# Patient Record
Sex: Male | Born: 1940 | Race: Black or African American | Hispanic: No | Smoking: Never smoker
Health system: Southern US, Community
[De-identification: ages and names within clinical notes are randomized; demographics above are authoritative.]

## PROBLEM LIST (undated history)

## (undated) DIAGNOSIS — I639 Cerebral infarction, unspecified: Secondary | ICD-10-CM

## (undated) DIAGNOSIS — F0390 Unspecified dementia without behavioral disturbance: Secondary | ICD-10-CM

## (undated) DIAGNOSIS — E119 Type 2 diabetes mellitus without complications: Secondary | ICD-10-CM

---

## 2018-05-30 ENCOUNTER — Ambulatory Visit: Payer: Self-pay | Admitting: Registered"

## 2018-06-04 ENCOUNTER — Ambulatory Visit: Payer: Self-pay | Admitting: Registered"

## 2018-10-03 ENCOUNTER — Other Ambulatory Visit (HOSPITAL_COMMUNITY): Payer: Self-pay | Admitting: Interventional Radiology

## 2018-10-03 ENCOUNTER — Telehealth (HOSPITAL_COMMUNITY): Payer: Self-pay

## 2018-10-03 DIAGNOSIS — Z931 Gastrostomy status: Secondary | ICD-10-CM

## 2018-10-03 NOTE — Telephone Encounter (Signed)
Called pt's son to schedule, no answer, left vm. AW

## 2018-10-07 ENCOUNTER — Other Ambulatory Visit (HOSPITAL_COMMUNITY): Payer: Self-pay | Admitting: Surgery

## 2018-10-07 DIAGNOSIS — Z931 Gastrostomy status: Secondary | ICD-10-CM

## 2018-10-08 ENCOUNTER — Ambulatory Visit (HOSPITAL_COMMUNITY)
Admission: RE | Admit: 2018-10-08 | Discharge: 2018-10-08 | Disposition: A | Discharge status: Home / Self Care | Payer: Medicare Other | Source: Ambulatory Visit | Attending: Interventional Radiology | Admitting: Interventional Radiology

## 2018-10-08 ENCOUNTER — Other Ambulatory Visit (HOSPITAL_COMMUNITY): Payer: Self-pay | Admitting: Surgery

## 2018-10-08 ENCOUNTER — Encounter (HOSPITAL_COMMUNITY): Payer: Self-pay | Admitting: Interventional Radiology

## 2018-10-08 DIAGNOSIS — Z431 Encounter for attention to gastrostomy: Secondary | ICD-10-CM | POA: Insufficient documentation

## 2018-10-08 DIAGNOSIS — Z931 Gastrostomy status: Secondary | ICD-10-CM

## 2018-10-08 HISTORY — PX: IR REPLC GASTRO/COLONIC TUBE PERCUT W/FLUORO: IMG2333

## 2018-10-08 MED ORDER — IOPAMIDOL (ISOVUE-300) INJECTION 61%
INTRAVENOUS | Status: AC
  Administered 2018-10-08: 20 mL
  Filled 2018-10-08: qty 50

## 2018-10-08 MED ORDER — LIDOCAINE VISCOUS HCL 2 % MT SOLN
OROMUCOSAL | Status: AC
  Filled 2018-10-08: qty 15

## 2018-10-08 NOTE — Procedures (Signed)
  Procedure: Exchange 75f G tube under fluoro   EBL:   minimal Complications:  none immediate  See full dictation in [employer].  Thora Lance MD Main # [phone] Pager  [phone]

## 2019-09-09 ENCOUNTER — Encounter (HOSPITAL_COMMUNITY): Payer: Self-pay | Admitting: Emergency Medicine

## 2019-09-09 ENCOUNTER — Other Ambulatory Visit: Payer: Self-pay

## 2019-09-09 ENCOUNTER — Emergency Department (HOSPITAL_COMMUNITY)
Admission: EM | Admit: 2019-09-09 | Discharge: 2019-09-10 | Disposition: A | Discharge status: Home / Self Care | Payer: Medicare Other | Attending: Emergency Medicine | Admitting: Emergency Medicine

## 2019-09-09 DIAGNOSIS — E119 Type 2 diabetes mellitus without complications: Secondary | ICD-10-CM | POA: Insufficient documentation

## 2019-09-09 DIAGNOSIS — K9423 Gastrostomy malfunction: Secondary | ICD-10-CM

## 2019-09-09 DIAGNOSIS — F0390 Unspecified dementia without behavioral disturbance: Secondary | ICD-10-CM | POA: Insufficient documentation

## 2019-09-09 DIAGNOSIS — T85598A Other mechanical complication of other gastrointestinal prosthetic devices, implants and grafts, initial encounter: Secondary | ICD-10-CM | POA: Insufficient documentation

## 2019-09-09 DIAGNOSIS — Z431 Encounter for attention to gastrostomy: Secondary | ICD-10-CM

## 2019-09-09 DIAGNOSIS — Y69 Unspecified misadventure during surgical and medical care: Secondary | ICD-10-CM | POA: Insufficient documentation

## 2019-09-09 DIAGNOSIS — Z8673 Personal history of transient ischemic attack (TIA), and cerebral infarction without residual deficits: Secondary | ICD-10-CM | POA: Insufficient documentation

## 2019-09-09 HISTORY — DX: Type 2 diabetes mellitus without complications: E11.9

## 2019-09-09 HISTORY — DX: Cerebral infarction, unspecified: I63.9

## 2019-09-09 HISTORY — DX: Unspecified dementia, unspecified severity, without behavioral disturbance, psychotic disturbance, mood disturbance, and anxiety: F03.90

## 2019-09-09 NOTE — ED Triage Notes (Signed)
Pts dtr states pt is demented and pulled out his G tube. She has the tube in a bag. Pt uses for tube feelings. Denies any bleeding at the site.

## 2019-09-10 ENCOUNTER — Emergency Department (HOSPITAL_COMMUNITY): Payer: Medicare Other

## 2019-09-10 MED ORDER — IOHEXOL 300 MG/ML  SOLN
30.0000 mL | Freq: Once | INTRAMUSCULAR | Status: AC | PRN
  Administered 2019-09-10: 30 mL

## 2019-09-10 NOTE — ED Provider Notes (Signed)
[facility] EMERGENCY DEPARTMENT Provider Note   CSN: [id number] Arrival date & time: 09/09/19  1757     History Chief complaint - G-tube Complication   Level 5 caveat due to dementia Justin Hawkins is a 78 y.o. male.  HPI    Patient with history of dementia and diabetes, stroke presents with G-tube malfunction.  Patient accidentally pulled out his G-tube.  Patient's daughter-in-law says that when she came home from work around 4 PM he reports it had just fallen out.  However exact time is unknown.  Patient has had a G-tube for least 2 years. There are not sure where this was initially placed. Patient has no other acute complaints Past Medical History:  Diagnosis Date  . Dementia (HCC)   . Diabetes mellitus without complication (HCC)   . Stroke [facility])     There are no problems to display for this patient.   Past Surgical History:  Procedure Laterality Date  . IR REPLC GASTRO/COLONIC TUBE PERCUT W/FLUORO  10/08/2018       History reviewed. No pertinent family history.  Social History   Tobacco Use  . Smoking status: Never Smoker  . Smokeless tobacco: Never Used  Substance Use Topics  . Alcohol use: Yes  . Drug use: Never    Home Medications Prior to Admission medications   Not on File    Allergies    Patient has no known allergies.  Review of Systems   Review of Systems  Unable to perform ROS: Dementia  Constitutional: Negative for fever.  Gastrointestinal: Negative for vomiting.    Physical Exam Updated Vital Signs BP 124/84 (BP Location: Left Arm)   Pulse 93   Temp 98.8 F (37.1 C) (Oral)   Resp 16   SpO2 100%   Physical Exam CONSTITUTIONAL: Elderly, no acute distress HEAD: Normocephalic/atraumatic EYES: EOMI ENMT: Mask in place NECK: supple no meningeal signs CV: S1/S2 noted, no murmurs/rubs/gallops noted LUNGS: Lungs are clear to auscultation bilaterally, no apparent distress ABDOMEN: soft, nontender, G-tube stoma  noted left upper quadrant, no erythema, no drainage NEURO: Pt is awake/alert, moves all extremitiesx4.  No facial droop.   EXTREMITIES:full ROM SKIN: warm, color normal   ED Results / Procedures / Treatments   Labs (all labs ordered are listed, but only abnormal results are displayed) Labs Reviewed - No data to display  EKG None  Radiology DG ABDOMEN PEG TUBE LOCATION  Result Date: 09/10/2019 CLINICAL DATA:  PEG placement. EXAM: ABDOMEN - 1 VIEW COMPARISON:  None. FINDINGS: Portable AP view of the abdomen obtained after the installation of 30 cc Omnipaque 300 through indwelling gastrostomy tube. Contrast opacifies the stomach and duodenum. There is no evidence of extravasation or leak. Moderate colonic stool burden. No evidence of free air. IMPRESSION: Gastrostomy tube in the stomach with contrast opacifying the stomach and duodenum. No evidence of extravasation or leak. Electronically Signed   By: Narda Rutherford M.D.   On: 09/10/2019 04:24    Procedures Gastrostomy tube replacement  Date/Time: 09/10/2019 3:40 AM Performed by: Zadie Rhine, MD Authorized by: Zadie Rhine, MD  Consent: Verbal consent obtained. Risks and benefits: risks, benefits and alternatives were discussed Consent given by: patient and guardian Time out: Immediately prior to procedure a "time out" was called to verify the correct patient, procedure, equipment, support staff and site/side marked as required. Local anesthesia used: no  Anesthesia: Local anesthesia used: no  Sedation: Patient sedated: no  Patient tolerance: patient tolerated the procedure well with  no immediate complications Comments: Area around stoma was cleansed extensively with Betadine. Sterile precautions were followed.  I was able to place a 20 [place] gastrostomy tube without difficulty. I Inserted 6 mL of saline into the balloon.  Patient tolerated procedure well       Medications Ordered in ED Medications - No data to  display  ED Course  I have reviewed the triage vital signs and the nursing notes.  Pertinent  imaging results that were available during my care of the patient were reviewed by me and considered in my medical decision making (see chart for details).    MDM Rules/Calculators/A&P                      Patient presents from home after his PEG tube fell out. Family said he had it  for 2 years.  Patient tolerated replacement very well.  Patient has a history of dementia and it is used for medications and some nutrition.  Patient appears to have good home support with his daughter-in-law. X-ray reveals appropriate placement.  Patient will  be discharged home. Final Clinical Impression(s) / ED Diagnoses Final diagnoses:  Gastrostomy tube dysfunction [facility])    Rx / DC Orders ED Discharge Orders    None       Ripley Fraise, MD 09/10/19 0430

## 2020-09-02 ENCOUNTER — Emergency Department (HOSPITAL_COMMUNITY)
Admission: EM | Admit: 2020-09-02 | Discharge: 2020-09-02 | Disposition: A | Discharge status: Home / Self Care | Payer: Medicare Other | Attending: Emergency Medicine | Admitting: Emergency Medicine

## 2020-09-02 ENCOUNTER — Emergency Department (HOSPITAL_COMMUNITY): Payer: Medicare Other

## 2020-09-02 ENCOUNTER — Other Ambulatory Visit: Payer: Self-pay

## 2020-09-02 DIAGNOSIS — K9423 Gastrostomy malfunction: Secondary | ICD-10-CM | POA: Insufficient documentation

## 2020-09-02 DIAGNOSIS — E119 Type 2 diabetes mellitus without complications: Secondary | ICD-10-CM | POA: Diagnosis not present

## 2020-09-02 MED ORDER — IOHEXOL 300 MG/ML  SOLN
50.0000 mL | Freq: Once | INTRAMUSCULAR | Status: AC | PRN
  Administered 2020-09-02: 50 mL

## 2020-09-02 NOTE — ED Notes (Signed)
Pt vss at discharge, nadn, 67f placed by provider, pt denies any complaints.

## 2020-09-02 NOTE — ED Triage Notes (Signed)
Pt pulled out g-tube at home today. Pt in no pain, no bleeding

## 2020-09-02 NOTE — ED Notes (Signed)
Pt aao2, gcs14, vss, nadn. Pt family states uses 13f g tube at home. Pt denies any complaints. Pt to xray at this time in stretcher.

## 2020-09-02 NOTE — ED Provider Notes (Signed)
[facility] EMERGENCY DEPARTMENT Provider Note  CSN: [id number] Arrival date & time: 09/02/20 1532    History Chief Complaint  Patient presents with  . g-tube    HPI  Justin Hawkins is a 79 y.o. male with history of stroke and dementia brought by daughter after she found him in bed with his G-tube in his hand. She is unsure how long it had been out. He has a had a feeding tube for about 4-5 years, she thinks this particular tube has been in for about a year. He has otherwise been in his normal state of health.   Past Medical History:  Diagnosis Date  . Dementia (HCC)   . Diabetes mellitus without complication (HCC)   . Stroke [facility])     Past Surgical History:  Procedure Laterality Date  . IR REPLC GASTRO/COLONIC TUBE PERCUT W/FLUORO  10/08/2018    No family history on file.  Social History   Tobacco Use  . Smoking status: Never Smoker  . Smokeless tobacco: Never Used  Substance Use Topics  . Alcohol use: Yes  . Drug use: Never     Home Medications Prior to Admission medications   Not on File     Allergies    Patient has no known allergies.   Review of Systems   Review of Systems A comprehensive review of systems was completed and negative except as noted in HPI.    Physical Exam BP 128/83 (BP Location: Right Arm)   Pulse 78   Temp 98.2 F (36.8 C) (Oral)   Resp 18   SpO2 100%   Physical Exam Vitals and nursing note reviewed.  HENT:     Head: Normocephalic.     Nose: Nose normal.  Eyes:     Extraocular Movements: Extraocular movements intact.  Pulmonary:     Effort: Pulmonary effort is normal.  Abdominal:     Tenderness: There is no abdominal tenderness.     Comments: Stoma in LUQ without signs of leaking, infection or skin breakdown  Musculoskeletal:        General: Normal range of motion.     Cervical back: Neck supple.  Skin:    Findings: No rash (on exposed skin).  Neurological:     Mental Status: He is alert and oriented to person,  place, and time.  Psychiatric:        Mood and Affect: Mood normal.      ED Results / Procedures / Treatments   Labs (all labs ordered are listed, but only abnormal results are displayed) Labs Reviewed - No data to display  EKG None  Radiology DG ABDOMEN PEG TUBE LOCATION  Result Date: 09/02/2020 CLINICAL DATA:  Tube placement EXAM: ABDOMEN - 1 VIEW COMPARISON:  09/10/2019 FINDINGS: Contrast opacification of the stomach and duodenum. Gastrostomy tube projects over the proximal lumen of the stomach. No gross extravasation. IMPRESSION: Gastrostomy tube positioned over the proximal stomach. Contrast opacification of the stomach and duodenum without gross contrast extravasation. Electronically Signed   By: Jasmine Pang M.D.   On: 09/02/2020 20:39    Procedures Gastrostomy tube replacement  Date/Time: 09/02/2020 8:08 PM Performed by: Pollyann Savoy, MD Authorized by: Pollyann Savoy, MD  Consent: Verbal consent obtained. Consent given by: guardian Time out: Immediately prior to procedure a "time out" was called to verify the correct patient, procedure, equipment, support staff and site/side marked as required. Local anesthesia used: no  Anesthesia: Local anesthesia used: no  Sedation: Patient sedated:  no  Patient tolerance: patient tolerated the procedure well with no immediate complications Comments: 20Fr G tube placed. Initially some difficulty finding a tract, but after probing with a cotton swab, the G-tube went in without difficulty     Medications Ordered in the ED Medications  iohexol (OMNIPAQUE) 300 MG/ML solution 50 mL (50 mLs Per Tube Contrast Given 09/02/20 2036)     MDM Rules/Calculators/A&P MDM Gtube replaced, will send for confirmatory xray. ED Course  I have reviewed the triage vital signs and the nursing notes.  Pertinent labs & imaging results that were available during my care of the patient were reviewed by me and considered in my medical  decision making (see chart for details).  Clinical Course as of 09/02/20 2044  Thu Sep 02, 2020  2043 Xray confirms proper positioning. Daughter advised to discuss regularly scheduled tube changes to avoid complications such as this. RTED for any other concerns.  [CS]    Clinical Course User Index [CS] Pollyann Savoy, MD    Final Clinical Impression(s) / ED Diagnoses Final diagnoses:  Gastrostomy malfunction [facility])    Rx / DC Orders ED Discharge Orders    None       Pollyann Savoy, MD 09/02/20 2044

## 2020-10-03 ENCOUNTER — Encounter (HOSPITAL_COMMUNITY): Payer: Self-pay | Admitting: Emergency Medicine

## 2020-10-03 ENCOUNTER — Inpatient Hospital Stay (HOSPITAL_COMMUNITY)
Admission: EM | Admit: 2020-10-03 | Discharge: 2020-10-07 | DRG: 177 | Disposition: A | Discharge status: Home Health | Payer: Medicare Other | Attending: Internal Medicine | Admitting: Internal Medicine

## 2020-10-03 ENCOUNTER — Emergency Department (HOSPITAL_COMMUNITY): Payer: Medicare Other

## 2020-10-03 ENCOUNTER — Other Ambulatory Visit: Payer: Self-pay

## 2020-10-03 DIAGNOSIS — E861 Hypovolemia: Secondary | ICD-10-CM | POA: Diagnosis present

## 2020-10-03 DIAGNOSIS — R32 Unspecified urinary incontinence: Secondary | ICD-10-CM | POA: Diagnosis present

## 2020-10-03 DIAGNOSIS — K4090 Unilateral inguinal hernia, without obstruction or gangrene, not specified as recurrent: Secondary | ICD-10-CM | POA: Diagnosis present

## 2020-10-03 DIAGNOSIS — K5900 Constipation, unspecified: Secondary | ICD-10-CM | POA: Diagnosis present

## 2020-10-03 DIAGNOSIS — R6510 Systemic inflammatory response syndrome (SIRS) of non-infectious origin without acute organ dysfunction: Secondary | ICD-10-CM | POA: Diagnosis present

## 2020-10-03 DIAGNOSIS — Z931 Gastrostomy status: Secondary | ICD-10-CM | POA: Diagnosis not present

## 2020-10-03 DIAGNOSIS — Z6826 Body mass index (BMI) 26.0-26.9, adult: Secondary | ICD-10-CM | POA: Diagnosis not present

## 2020-10-03 DIAGNOSIS — U071 COVID-19: Secondary | ICD-10-CM | POA: Diagnosis present

## 2020-10-03 DIAGNOSIS — Z7189 Other specified counseling: Secondary | ICD-10-CM | POA: Diagnosis not present

## 2020-10-03 DIAGNOSIS — E119 Type 2 diabetes mellitus without complications: Secondary | ICD-10-CM | POA: Diagnosis present

## 2020-10-03 DIAGNOSIS — R7989 Other specified abnormal findings of blood chemistry: Secondary | ICD-10-CM | POA: Diagnosis present

## 2020-10-03 DIAGNOSIS — E871 Hypo-osmolality and hyponatremia: Secondary | ICD-10-CM | POA: Diagnosis present

## 2020-10-03 DIAGNOSIS — R54 Age-related physical debility: Secondary | ICD-10-CM | POA: Diagnosis present

## 2020-10-03 DIAGNOSIS — E43 Unspecified severe protein-calorie malnutrition: Secondary | ICD-10-CM | POA: Diagnosis present

## 2020-10-03 DIAGNOSIS — F0390 Unspecified dementia without behavioral disturbance: Secondary | ICD-10-CM

## 2020-10-03 DIAGNOSIS — I10 Essential (primary) hypertension: Secondary | ICD-10-CM | POA: Diagnosis present

## 2020-10-03 DIAGNOSIS — D649 Anemia, unspecified: Secondary | ICD-10-CM | POA: Diagnosis present

## 2020-10-03 DIAGNOSIS — R64 Cachexia: Secondary | ICD-10-CM | POA: Diagnosis present

## 2020-10-03 DIAGNOSIS — Z515 Encounter for palliative care: Secondary | ICD-10-CM | POA: Diagnosis not present

## 2020-10-03 DIAGNOSIS — Z7984 Long term (current) use of oral hypoglycemic drugs: Secondary | ICD-10-CM

## 2020-10-03 DIAGNOSIS — E876 Hypokalemia: Secondary | ICD-10-CM | POA: Diagnosis present

## 2020-10-03 DIAGNOSIS — Z789 Other specified health status: Secondary | ICD-10-CM | POA: Diagnosis not present

## 2020-10-03 DIAGNOSIS — R627 Adult failure to thrive: Secondary | ICD-10-CM | POA: Diagnosis present

## 2020-10-03 DIAGNOSIS — Z8673 Personal history of transient ischemic attack (TIA), and cerebral infarction without residual deficits: Secondary | ICD-10-CM | POA: Diagnosis not present

## 2020-10-03 LAB — D-DIMER, QUANTITATIVE: D-Dimer, Quant: 0.43 ug/mL-FEU (ref 0.00–0.50)

## 2020-10-03 LAB — TRIGLYCERIDES: Triglycerides: 55 mg/dL (ref <150)

## 2020-10-03 LAB — COMPREHENSIVE METABOLIC PANEL
ALT: 22 U/L (ref 0–44)
AST: 28 U/L (ref 15–41)
Albumin: 2.9 g/dL — ABNORMAL LOW (ref 3.5–5.0)
Alkaline Phosphatase: 66 U/L (ref 38–126)
Anion gap: 12 (ref 5–15)
BUN: 25 mg/dL — ABNORMAL HIGH (ref 8–23)
CO2: 24 mmol/L (ref 22–32)
Calcium: 9.7 mg/dL (ref 8.9–10.3)
Chloride: 91 mmol/L — ABNORMAL LOW (ref 98–111)
Creatinine, Ser: 0.83 mg/dL (ref 0.61–1.24)
GFR, Estimated: >60 mL/min (ref >60)
Glucose, Bld: 165 mg/dL — ABNORMAL HIGH (ref 70–99)
Potassium: 4.4 mmol/L (ref 3.5–5.1)
Sodium: 127 mmol/L — ABNORMAL LOW (ref 135–145)
Total Bilirubin: 0.4 mg/dL (ref 0.3–1.2)
Total Protein: 5.9 g/dL — ABNORMAL LOW (ref 6.5–8.1)

## 2020-10-03 LAB — T4, FREE: Free T4: 1.37 ng/dL — ABNORMAL HIGH (ref 0.61–1.12)

## 2020-10-03 LAB — TROPONIN I (HIGH SENSITIVITY)
Troponin I (High Sensitivity): 45 ng/L — ABNORMAL HIGH (ref <18)
Troponin I (High Sensitivity): 10 ng/L (ref <18)

## 2020-10-03 LAB — CBC WITH DIFFERENTIAL/PLATELET
Abs Immature Granulocytes: 0.02 10*3/uL (ref 0.00–0.07)
Basophils Absolute: 0 10*3/uL (ref 0.0–0.1)
Basophils Relative: 0 %
Eosinophils Absolute: 0 10*3/uL (ref 0.0–0.5)
Eosinophils Relative: 0 %
HCT: 35.2 % — ABNORMAL LOW (ref 39.0–52.0)
Hemoglobin: 12.1 g/dL — ABNORMAL LOW (ref 13.0–17.0)
Immature Granulocytes: 1 %
Lymphocytes Relative: 14 %
Lymphs Abs: 0.5 10*3/uL — ABNORMAL LOW (ref 0.7–4.0)
MCH: 29.4 pg (ref 26.0–34.0)
MCHC: 34.4 g/dL (ref 30.0–36.0)
MCV: 85.6 fL (ref 80.0–100.0)
Monocytes Absolute: 0.3 10*3/uL (ref 0.1–1.0)
Monocytes Relative: 9 %
Neutro Abs: 2.5 10*3/uL (ref 1.7–7.7)
Neutrophils Relative %: 76 %
Platelets: 185 10*3/uL (ref 150–400)
RBC: 4.11 MIL/uL — ABNORMAL LOW (ref 4.22–5.81)
RDW: 12.5 % (ref 11.5–15.5)
WBC: 3.3 10*3/uL — ABNORMAL LOW (ref 4.0–10.5)
nRBC: 0 % (ref 0.0–0.2)

## 2020-10-03 LAB — TSH: TSH: 0.901 u[IU]/mL (ref 0.350–4.500)

## 2020-10-03 LAB — PROCALCITONIN: Procalcitonin: <0.1 ng/mL

## 2020-10-03 LAB — LACTIC ACID, PLASMA
Lactic Acid, Venous: 4.2 mmol/L (ref 0.5–1.9)
Lactic Acid, Venous: 2.8 mmol/L (ref 0.5–1.9)

## 2020-10-03 LAB — RESP PANEL BY RT-PCR (FLU A&B, COVID) ARPGX2
Influenza A by PCR: NEGATIVE
Influenza B by PCR: NEGATIVE
SARS Coronavirus 2 by RT PCR: POSITIVE — AB

## 2020-10-03 LAB — BRAIN NATRIURETIC PEPTIDE: B Natriuretic Peptide: 46 pg/mL (ref 0.0–100.0)

## 2020-10-03 LAB — FERRITIN: Ferritin: 226 ng/mL (ref 24–336)

## 2020-10-03 LAB — PROTIME-INR
INR: 0.9 (ref 0.8–1.2)
Prothrombin Time: 12.1 seconds (ref 11.4–15.2)

## 2020-10-03 LAB — LACTATE DEHYDROGENASE: LDH: 128 U/L (ref 98–192)

## 2020-10-03 LAB — C-REACTIVE PROTEIN: CRP: 2.2 mg/dL — ABNORMAL HIGH (ref <1.0)

## 2020-10-03 LAB — FIBRINOGEN: Fibrinogen: 553 mg/dL — ABNORMAL HIGH (ref 210–475)

## 2020-10-03 MED ORDER — SODIUM CHLORIDE 0.9 % IV SOLN
100.0000 mg | Freq: Every day | INTRAVENOUS | Status: AC
  Administered 2020-10-04 – 2020-10-05 (×2): 100 mg via INTRAVENOUS
  Filled 2020-10-03: qty 20
  Filled 2020-10-03: qty 100
  Filled 2020-10-03: qty 20

## 2020-10-03 MED ORDER — DEXAMETHASONE SODIUM PHOSPHATE 10 MG/ML IJ SOLN
6.0000 mg | Freq: Every day | INTRAMUSCULAR | Status: DC
  Administered 2020-10-04: 6 mg via INTRAVENOUS
  Filled 2020-10-03: qty 1

## 2020-10-03 MED ORDER — LACTATED RINGERS IV BOLUS
1000.0000 mL | Freq: Once | INTRAVENOUS | Status: AC
  Administered 2020-10-03: 1000 mL via INTRAVENOUS

## 2020-10-03 MED ORDER — SODIUM CHLORIDE 0.9 % IV SOLN
200.0000 mg | Freq: Once | INTRAVENOUS | Status: AC
  Administered 2020-10-04: 200 mg via INTRAVENOUS
  Filled 2020-10-03: qty 40

## 2020-10-03 MED ORDER — PIPERACILLIN-TAZOBACTAM 3.375 G IVPB 30 MIN
3.3750 g | Freq: Once | INTRAVENOUS | Status: AC
  Administered 2020-10-04: 3.375 g via INTRAVENOUS
  Filled 2020-10-03: qty 50

## 2020-10-03 NOTE — ED Notes (Signed)
Date and time results received: 10/03/20 2112 (use smartphrase ".now" to insert current time)  Test: Lactic Acid Critical Value: 4.2  Name of Provider Notified: Rancour, MD  Orders Received? Or Actions Taken?:

## 2020-10-03 NOTE — ED Triage Notes (Signed)
BIB [facility] from home. Pt lives at home with family. Hx: dementia. Increased weakness and productive cough x2 days, incontinence with foul smelling urine and fever per family. Pt hasn't being eating well.   109 heart rate  101.3 tympanic.

## 2020-10-03 NOTE — ED Provider Notes (Signed)
[facility] EMERGENCY DEPARTMENT Provider Note   CSN: [id number] Arrival date & time: 10/03/20  2012     History Chief Complaint  Patient presents with  . Weakness    Justin Hawkins is a 80 y.o. male.  HPI Pt is a 80 y/o male with a history of dementia, DM, prior stroke, G-tube in place who presents to ED with complaint of weakness. Pt states his primary complaint is shortness of breath and he states that he has noticed "cold" symptoms and dyspnea starting today. Pt states he lives with his son. Pt persistently coughing and appears dyspneic throughout interview, unable to answer most of my questions.  Have contacted pt's daughter-in-law Michaeal Davis [phone]: - Started having incontinence, not swallowing/chewing his food, very congested x about 2-3 days - "A little incoherent" - Has had some choking, leaving food in his mouth - COVID exposure - daughter-in-law's grandson tested positive for COVID on Friday (10/01/20) after being around the family at Christmastime, pt's son also has been a little sick but now better - Pt has had 2 vaccines, no booster yet - Allowed to eat by mouth, but has g-tube for nutritional supplementation - Has been out of his BP medications for a few days    Past Medical History:  Diagnosis Date  . Dementia (HCC)   . Diabetes mellitus without complication (HCC)   . Stroke [facility])     There are no problems to display for this patient.   Past Surgical History:  Procedure Laterality Date  . IR REPLC GASTRO/COLONIC TUBE PERCUT W/FLUORO  10/08/2018       No family history on file.  Social History   Tobacco Use  . Smoking status: Never Smoker  . Smokeless tobacco: Never Used  Substance Use Topics  . Alcohol use: Yes  . Drug use: Never    Home Medications Prior to Admission medications   Not on File    Allergies    Patient has no known allergies.  Review of Systems   Review of Systems  Unable to perform ROS: Dementia  (Pt persistently coughing and having trouble completing ROS; pt also demented. Have included symptoms noted by pt and daughter in law.)  Constitutional: Negative for chills and fever.  HENT: Positive for congestion, rhinorrhea and trouble swallowing.   Respiratory: Positive for cough and shortness of breath.   Gastrointestinal: Negative for diarrhea and vomiting.  Skin: Negative for color change and rash.    Physical Exam Updated Vital Signs BP (!) 141/79 (BP Location: Left Arm)   Pulse (!) 104   Temp 98.4 F (36.9 C) (Oral) Comment: oral, febrile with EMS  Resp (!) 24   SpO2 98%   Physical Exam Vitals and nursing note reviewed.  Constitutional:      General: He is in acute distress.     Appearance: He is well-developed and well-nourished. He is ill-appearing. He is not toxic-appearing.  HENT:     Head: Normocephalic and atraumatic.  Eyes:     General: No scleral icterus. Cardiovascular:     Rate and Rhythm: Regular rhythm. Tachycardia present.     Pulses: Normal pulses.     Heart sounds: No friction rub. No gallop.   Pulmonary:     Breath sounds: Wheezing, rhonchi and rales present.     Comments: Frequent productive cough noted on exam Abdominal:     Palpations: Abdomen is soft.     Tenderness: There is no abdominal tenderness. There is no guarding  or rebound.     Comments: G-tube in place left upper quadrant.  There is no surrounding induration, erythema, or drainage.  Musculoskeletal:        General: No edema.     Cervical back: Neck supple.     Right lower leg: No edema.     Left lower leg: No edema.  Skin:    General: Skin is warm and dry.  Neurological:     Mental Status: He is alert.     Comments: Alert, moves all extremities spontaneously, attempts to answer questions.  Difficulty attending to questions.  There is significant hearing loss.  Psychiatric:        Mood and Affect: Mood and affect normal.        Behavior: Behavior normal.     ED Results /  Procedures / Treatments   Labs (all labs ordered are listed, but only abnormal results are displayed) Labs Reviewed  RESP PANEL BY RT-PCR (FLU A&B, COVID) ARPGX2 - Abnormal; Notable for the following components:      Result Value   SARS Coronavirus 2 by RT PCR POSITIVE (*)    All other components within normal limits  COMPREHENSIVE METABOLIC PANEL - Abnormal; Notable for the following components:   Sodium 127 (*)    Chloride 91 (*)    Glucose, Bld 165 (*)    BUN 25 (*)    Total Protein 5.9 (*)    Albumin 2.9 (*)    All other components within normal limits  LACTIC ACID, PLASMA - Abnormal; Notable for the following components:   Lactic Acid, Venous 4.2 (*)    All other components within normal limits  LACTIC ACID, PLASMA - Abnormal; Notable for the following components:   Lactic Acid, Venous 2.8 (*)    All other components within normal limits  CBC WITH DIFFERENTIAL/PLATELET - Abnormal; Notable for the following components:   WBC 3.3 (*)    RBC 4.11 (*)    Hemoglobin 12.1 (*)    HCT 35.2 (*)    Lymphs Abs 0.5 (*)    All other components within normal limits  T4, FREE - Abnormal; Notable for the following components:   Free T4 1.37 (*)    All other components within normal limits  FIBRINOGEN - Abnormal; Notable for the following components:   Fibrinogen 553 (*)    All other components within normal limits  C-REACTIVE PROTEIN - Abnormal; Notable for the following components:   CRP 2.2 (*)    All other components within normal limits  TROPONIN I (HIGH SENSITIVITY) - Abnormal; Notable for the following components:   Troponin I (High Sensitivity) 45 (*)    All other components within normal limits  CULTURE, BLOOD (ROUTINE X 2)  CULTURE, BLOOD (ROUTINE X 2)  PROTIME-INR  TSH  PROCALCITONIN  LACTATE DEHYDROGENASE  FERRITIN  TRIGLYCERIDES  D-DIMER, QUANTITATIVE (NOT AT [facility])  BRAIN NATRIURETIC PEPTIDE  URINALYSIS, ROUTINE W REFLEX MICROSCOPIC  HEMOGLOBIN A1C  CBC   CREATININE, SERUM  TROPONIN I (HIGH SENSITIVITY)    EKG EKG Interpretation  Date/Time:  Sunday October 03 2020 22:51:49 EST Ventricular Rate:  158 PR Interval:    QRS Duration: 160 QT Interval:  343 QTC Calculation: 441 R Axis:   43 Text Interpretation: Artifact Sinus tachycardia Artifact in lead(s) II aVF V1 V2 V3 V4 V5 V6 Confirmed by Glynn Octave [id number]) on 10/03/2020 10:56:29 PM   Radiology DG Chest Portable 1 View  Result Date: 10/03/2020 CLINICAL DATA:  Infection. Increased  weakness and cough for 2 days. Fever. EXAM: PORTABLE CHEST 1 VIEW COMPARISON:  None. FINDINGS: Lung volumes are low. No focal airspace disease. Normal heart size. Mild aortic tortuosity. Otherwise normal mediastinal contours. No pulmonary edema, pleural fluid or pneumothorax. Multiple skin folds project over the chest. Scoliotic curvature of the spine. No acute osseous abnormalities are seen. IMPRESSION: Low lung volumes without acute abnormality. Electronically Signed   By: Narda Rutherford M.D.   On: 10/03/2020 21:09    Procedures Procedures (including critical care time)  Medications Ordered in ED Medications  remdesivir 200 mg in sodium chloride 0.9% 250 mL IVPB (0 mg Intravenous Stopped 10/04/20 0046)    Followed by  remdesivir 100 mg in sodium chloride 0.9 % 100 mL IVPB (has no administration in time range)  lactated ringers bolus 1,000 mL (0 mLs Intravenous Stopped 10/04/20 0046)    ED Course  I have reviewed the triage vital signs and the nursing notes.  Pertinent labs & imaging results that were available during my care of the patient were reviewed by me and considered in my medical decision making (see chart for details).    MDM Rules/Calculators/A&P                          80 year old male with cough, congestion, changes in mental status, family concerns.  I am most concerned for possible COVID-19 in context of recent exposure to positive family member, but differential includes  aspiration pneumonia/pneumonitis, UTI, delirium.  Labs reviewed and notable for positive SARS-CoV-2 and picture concerning for possible sepsis.  Lactic acid 4.2.  Troponin 45 --> 10.  Mild hyponatremia, BUN elevated, leukopenia.  CXR AP portable shows low lung volumes but otherwise no abnormality noted. U/A pending.  Impression is likely COVID-19 pneumonia.  Given patient's chronic illness, risk for aspiration and change in mental status with worsening PO intake, feel he would benefit from admission.  IV fluid resuscitation started with single LR bolus.  Remdesivir and Decadron ordered.  Pt will be admitted to the hospitalist service. He remained in stable condition at time of admission.  Final Clinical Impression(s) / ED Diagnoses Final diagnoses:  COVID-19  Dementia without behavioral disturbance, unspecified dementia type (HCC)     Corliss Blacker, MD 10/04/20 2229    Glynn Octave, MD 10/04/20 1301

## 2020-10-04 ENCOUNTER — Encounter (HOSPITAL_COMMUNITY): Payer: Self-pay | Admitting: Internal Medicine

## 2020-10-04 ENCOUNTER — Inpatient Hospital Stay (HOSPITAL_COMMUNITY): Payer: Medicare Other

## 2020-10-04 DIAGNOSIS — F0390 Unspecified dementia without behavioral disturbance: Secondary | ICD-10-CM

## 2020-10-04 DIAGNOSIS — U071 COVID-19: Secondary | ICD-10-CM | POA: Diagnosis not present

## 2020-10-04 DIAGNOSIS — Z515 Encounter for palliative care: Secondary | ICD-10-CM

## 2020-10-04 DIAGNOSIS — R6510 Systemic inflammatory response syndrome (SIRS) of non-infectious origin without acute organ dysfunction: Secondary | ICD-10-CM | POA: Diagnosis not present

## 2020-10-04 DIAGNOSIS — Z7189 Other specified counseling: Secondary | ICD-10-CM

## 2020-10-04 DIAGNOSIS — D649 Anemia, unspecified: Secondary | ICD-10-CM | POA: Diagnosis present

## 2020-10-04 DIAGNOSIS — Z789 Other specified health status: Secondary | ICD-10-CM

## 2020-10-04 LAB — CREATININE, SERUM
Creatinine, Ser: 0.68 mg/dL (ref 0.61–1.24)
GFR, Estimated: >60 mL/min (ref >60)

## 2020-10-04 LAB — CBC
HCT: 36.6 % — ABNORMAL LOW (ref 39.0–52.0)
Hemoglobin: 12.7 g/dL — ABNORMAL LOW (ref 13.0–17.0)
MCH: 29.8 pg (ref 26.0–34.0)
MCHC: 34.7 g/dL (ref 30.0–36.0)
MCV: 85.9 fL (ref 80.0–100.0)
Platelets: 179 10*3/uL (ref 150–400)
RBC: 4.26 MIL/uL (ref 4.22–5.81)
RDW: 12.4 % (ref 11.5–15.5)
WBC: 4.2 10*3/uL (ref 4.0–10.5)
nRBC: 0 % (ref 0.0–0.2)

## 2020-10-04 LAB — CBG MONITORING, ED
Glucose-Capillary: 145 mg/dL — ABNORMAL HIGH (ref 70–99)
Glucose-Capillary: 63 mg/dL — ABNORMAL LOW (ref 70–99)
Glucose-Capillary: 114 mg/dL — ABNORMAL HIGH (ref 70–99)
Glucose-Capillary: 122 mg/dL — ABNORMAL HIGH (ref 70–99)
Glucose-Capillary: 118 mg/dL — ABNORMAL HIGH (ref 70–99)

## 2020-10-04 LAB — URINALYSIS, ROUTINE W REFLEX MICROSCOPIC
Bilirubin Urine: NEGATIVE
Glucose, UA: 50 mg/dL — AB
Hgb urine dipstick: NEGATIVE
Ketones, ur: NEGATIVE mg/dL
Leukocytes,Ua: NEGATIVE
Nitrite: NEGATIVE
Protein, ur: NEGATIVE mg/dL
Specific Gravity, Urine: 1.01 (ref 1.005–1.030)
pH: 7 (ref 5.0–8.0)

## 2020-10-04 LAB — HEMOGLOBIN A1C
Hgb A1c MFr Bld: 7.7 % — ABNORMAL HIGH (ref 4.8–5.6)
Mean Plasma Glucose: 174.29 mg/dL

## 2020-10-04 MED ORDER — BISACODYL 10 MG RE SUPP
10.0000 mg | Freq: Once | RECTAL | Status: DC
  Filled 2020-10-04: qty 1

## 2020-10-04 MED ORDER — POLYETHYLENE GLYCOL 3350 17 G PO PACK: 17.0000 g | PACK | Freq: Every day | ORAL | Status: DC

## 2020-10-04 MED ORDER — IOHEXOL 300 MG/ML  SOLN
75.0000 mL | Freq: Once | INTRAMUSCULAR | Status: AC | PRN
  Administered 2020-10-04: 75 mL via INTRAVENOUS

## 2020-10-04 MED ORDER — INSULIN ASPART 100 UNIT/ML ~~LOC~~ SOLN
0.0000 [IU] | Freq: Three times a day (TID) | SUBCUTANEOUS | Status: DC
  Administered 2020-10-06: 1 [IU] via SUBCUTANEOUS
  Administered 2020-10-06: 2 [IU] via SUBCUTANEOUS

## 2020-10-04 MED ORDER — PIPERACILLIN-TAZOBACTAM 3.375 G IVPB: 3.3750 g | Freq: Three times a day (TID) | INTRAVENOUS | Status: DC

## 2020-10-04 MED ORDER — ENOXAPARIN SODIUM 40 MG/0.4ML ~~LOC~~ SOLN
40.0000 mg | SUBCUTANEOUS | Status: DC
  Administered 2020-10-04: 40 mg via SUBCUTANEOUS
  Filled 2020-10-04: qty 0.4

## 2020-10-04 MED ORDER — DEXTROSE-NACL 5-0.9 % IV SOLN: INTRAVENOUS | Status: DC

## 2020-10-04 MED ORDER — LACTATED RINGERS IV SOLN: INTRAVENOUS | Status: DC

## 2020-10-04 MED ORDER — ACETAMINOPHEN 650 MG RE SUPP: 650.0000 mg | Freq: Four times a day (QID) | RECTAL | Status: DC | PRN

## 2020-10-04 MED ORDER — HYDRALAZINE HCL 20 MG/ML IJ SOLN: 10.0000 mg | INTRAMUSCULAR | Status: DC | PRN

## 2020-10-04 MED ORDER — ACETAMINOPHEN 325 MG PO TABS: 650.0000 mg | ORAL_TABLET | Freq: Four times a day (QID) | ORAL | Status: DC | PRN

## 2020-10-04 MED ORDER — INSULIN ASPART 100 UNIT/ML ~~LOC~~ SOLN
0.0000 [IU] | SUBCUTANEOUS | Status: DC
  Administered 2020-10-04: 1 [IU] via SUBCUTANEOUS

## 2020-10-04 MED ORDER — POLYETHYLENE GLYCOL 3350 17 G PO PACK
17.0000 g | PACK | Freq: Every day | ORAL | Status: DC
  Administered 2020-10-04 – 2020-10-05 (×2): 17 g
  Filled 2020-10-04 (×2): qty 1

## 2020-10-04 MED ORDER — DEXTROSE 50 % IV SOLN
1.0000 | Freq: Once | INTRAVENOUS | Status: AC
  Administered 2020-10-04: 50 mL via INTRAVENOUS
  Filled 2020-10-04: qty 50

## 2020-10-04 NOTE — Progress Notes (Signed)
Pharmacy Antibiotic Note  Justin Hawkins is a 80 y.o. male admitted on 10/03/2020 with pneumonia.  Pharmacy has been consulted for zosyn dosing.  Plan: Zosyn 3.375g IV q8h (4 hour infusion). F/u renal function, cultures and clinical course    Temp (24hrs), Avg:98.8 F (37.1 C), Min:98.4 F (36.9 C), Max:99.2 F (37.3 C)  Recent Labs  Lab 10/03/20 2010 10/03/20 2249  WBC 3.3*  --   CREATININE 0.83  --   LATICACIDVEN 4.2* 2.8*    CrCl cannot be calculated (Unknown ideal weight.).    No Known Allergies  Thank you for allowing pharmacy to be a part of this patient's care.  Talbert Cage P[place]et 10/04/2020 12:32 AM

## 2020-10-04 NOTE — ED Notes (Signed)
Spoke with Duanne Guess and and daughter Carollee Herter, gave update on pt and care. Per son pt has been holding food in mouth the last few days, hasn't been swallowing. Pt has been coughing. Pt does get water and ensure through his G-tube when he doesn't want to eat or drink. Pt is supposed to use a walker but usually forgets, has been incontinent for the last few days.

## 2020-10-04 NOTE — ED Notes (Signed)
Unable to obtain EKG due to artifact and pt continuously coughing.

## 2020-10-04 NOTE — ED Notes (Signed)
Page returned, orders to be placed.

## 2020-10-04 NOTE — ED Notes (Signed)
Provider at bedside

## 2020-10-04 NOTE — Progress Notes (Signed)
PROGRESS NOTE    Justin Dillingrthur L Lampton  WUJ:[id number] DOB: 21-Jan-1941 DOA: 10/03/2020 PCP: [facility]    Brief Narrative:  Justin Hawkins is a 80 year-old male with past medical history significant for dementia, type 2 diabetes mellitus, essential hypertension, previous CVA who was brought to the emergency department after patient has not been eating well over the last 2 days with cold symptoms with persistent cough.  Patient with a PEG tube, family states does not usually use the PEG for nutrition, only when he does not eat anything.  No reported nausea, vomiting, diarrhea, chest pain or shortness of breath.  Patient apparently not "acting like himself" which is a change and thus he was brought to the ED for further evaluation.  In the ED, temperature 98.4, HR 104, RR 24, BP 141/79, SPO2 98% on room air.  Sodium 127, potassium 4.4, chloride 91, CO2 24, glucose 165, BUN 25, creatinine 0.83, AST 28, ALT 22, total bilirubin 0.4.  WBC 3.3, hemoglobin 12.1, platelet 185.  Troponin 45>10.  Lactic acid 4.2>2.8.  INR 0.9.  TSH 0.901, free T4 1.37.  Covid-19 PCR positive.  Influenza A/B PCR negative.  Chest x-ray with low lung volumes, otherwise no acute cardiopulmonary disease process.  Patient was started on Zosyn by ED physician for concern of underlying infectious etiology.  Hospitalist service consulted for further evaluation and treatment adult failure to thrive, and COVID-19 viral infection.   Assessment & Plan:   Principal Problem:   SIRS (systemic inflammatory response syndrome) (HCC) Active Problems:   COVID-19 virus infection   Normocytic normochromic anemia   SIRS, POA Acute Covid-19 viral infection during the ongoing Covid 19 Pandemic - POA Patient presenting from home with poor oral intake, failure to thrive.  Was found to be incidentally positive for COVID-19 PCR.  Son reports that patient is vaccinated, has received 2 doses of the Pfizer vaccine, but has not received a booster  as son reports it is not that time as of yet.  WBC and procalcitonin within normal limits, discontinued Zosyn. --COVID test: + PCR 1/9 --CRP 2.2 --ddimer 0.43 --Remdesivir, plan 5-day course (Day #1/3) -- Currently oxygenating well on room air --Continue supportive care with albuterol MDI prn, vitamin C, zinc, Tylenol, antitussives (benzonatate/ Mucinex/Tussionex) --Follow CBC, CMP, D-dimer, and CRP daily --Continue airborne/contact isolation precautions for 3 weeks from the day of diagnosis  The treatment plan and use of medications and known side effects were discussed with patient/family. Some of the medications used are based on case reports/anecdotal data.  All other medications being used in the management of COVID-19 based on limited study data.  Complete risks and long-term side effects are unknown, however in the best clinical judgment they seem to be of some benefit.  Patient wanted to proceed with treatment options provided.  Constipation CT abdomen/pelvis with contrast with no acute intra-abdominal abnormality, gastrotomy tube in place with large colonic stool burden with diffuse mild distention. --miralax per tube daily --bisacodyl supp x 1 today --Continue monitor output closely  Adult failure to thrive Patient presenting from home with poor oral intake in the preceding 2 days with associated cold symptoms and persistent cough.  Was found to be incidentally positive for COVID-19 as above.  Patient also with history of dementia and chronic PEG tube in place; although patient only utilizes PEG tube occasionally when he does not eat. --SLP evaluation for possible underlying dysphagia, may need to start tube feeds --Palliative care consulted for goals of care, medical  decision making  Hyponatremia Sodium 127 on admission, likely hypovolemic hyponatremia in the setting of poor oral intake. --Continue D5 NS at 75 MLS per hour --Follow BMP daily  Dementia Patient is pleasantly  confused. --Keep blinds open and lights on during daylight hours --Minimize the use of opioids/benzodiazepines  Type 2 diabetes mellitus Hemoglobin A1c 7.7.  On metformin 1000 mg p.o. daily at home. --Insulin sliding scale for coverage while inpatient --CBGs QAC/HS  Severe protein calorie malnutrition Patient with significant muscle wasting, thin and cachectic in appearance.  Has had poor appetite over the last 2 days, but suspect this has been going ongoing for some time.  Has a PEG tube in place, only utilizes per family when he does not eat much. --Pending SLP evaluation for ability to swallow -- Will consult nutrition following speech evaluation for diet/nutrition supplementation recommendations   DVT prophylaxis: Lovenox Code Status: Full code Family Communication: Updated patient's son, Marilu FavreClarence via telephone this morning  Disposition Plan:  Status is: Inpatient  Remains inpatient appropriate because:Persistent severe electrolyte disturbances, Altered mental status, Ongoing diagnostic testing needed not appropriate for outpatient work up, Unsafe d/c plan, IV treatments appropriate due to intensity of illness or inability to take PO and Inpatient level of care appropriate due to severity of illness   Dispo: The patient is from: Home              Anticipated d/c is to: Home              Anticipated d/c date is: 3 days              Patient currently is not medically stable to d/c.   Consultants:   Palliative care  Procedures:   none  Antimicrobials:   Zosyn 1/9 - 1/10   Subjective: Patient seen and examined at bedside, continues in ED holding area.  Pleasantly confused.  No specific complaints this morning.  No family present at bedside.  Unable to obtain any further ROS given his underlying dementia, although no specific complaints.  Nursing concerned this morning with borderline low glucose.  Otherwise no acute concerns overnight per nursing  staff.  Objective: Vitals:   10/04/20 0630 10/04/20 0700 10/04/20 0715 10/04/20 0900  BP: 118/71 117/69 102/76 106/75  Pulse: 94 96 95 (!) 102  Resp: 20 (!) 21 (!) 23 16  Temp:    98.2 F (36.8 C)  TempSrc:    Oral  SpO2: 100% 98% 98% 96%    Intake/Output Summary (Last 24 hours) at 10/04/2020 1026 Last data filed at 10/04/2020 0940 Gross per 24 hour  Intake 600 ml  Output --  Net 600 ml   There were no vitals filed for this visit.  Examination:  General exam: Appears calm and comfortable, pleasantly confused, thin/cachectic in appearance with significant muscle wasting Respiratory system: Clear to auscultation. Respiratory effort normal.  Oxygenating well on room air Cardiovascular system: S1 & S2 heard, RRR. No JVD, murmurs, rubs, gallops or clicks. No pedal edema. Gastrointestinal system: Abdomen is nondistended, soft and nontender. No organomegaly or masses felt. Normal bowel sounds heard.  PEG tube noted Central nervous system: Alert, not oriented to person/place/time/situation. No focal neurological deficits. Extremities: Symmetric 5 x 5 power. Skin: No rashes, lesions or ulcers Psychiatry: Judgement and insight appear poor. Mood & affect appropriate.     Data Reviewed: I have personally reviewed following labs and imaging studies  CBC: Recent Labs  Lab 10/03/20 2010 10/04/20 0406  WBC 3.3* 4.2  NEUTROABS 2.5  --   HGB 12.1* 12.7*  HCT 35.2* 36.6*  MCV 85.6 85.9  PLT 185 179   Basic Metabolic Panel: Recent Labs  Lab 10/03/20 2010 10/04/20 0406  NA 127*  --   K 4.4  --   CL 91*  --   CO2 24  --   GLUCOSE 165*  --   BUN 25*  --   CREATININE 0.83 0.68  CALCIUM 9.7  --    GFR: CrCl cannot be calculated (Unknown ideal weight.). Liver Function Tests: Recent Labs  Lab 10/03/20 2010  AST 28  ALT 22  ALKPHOS 66  BILITOT 0.4  PROT 5.9*  ALBUMIN 2.9*   No results for input(s): LIPASE, AMYLASE in the last 168 hours. No results for input(s): AMMONIA  in the last 168 hours. Coagulation Profile: Recent Labs  Lab 10/03/20 2010  INR 0.9   Cardiac Enzymes: No results for input(s): CKTOTAL, CKMB, CKMBINDEX, TROPONINI in the last 168 hours. BNP (last 3 results) No results for input(s): PROBNP in the last 8760 hours. HbA1C: Recent Labs    10/04/20 0406  HGBA1C 7.7*   CBG: Recent Labs  Lab 10/04/20 0400 10/04/20 0907  GLUCAP 145* 63*   Lipid Profile: Recent Labs    10/03/20 2250  TRIG 55   Thyroid Function Tests: Recent Labs    10/03/20 2010 10/03/20 2249  TSH  --  0.901  FREET4 1.37*  --    Anemia Panel: Recent Labs    10/03/20 2250  FERRITIN 226   Sepsis Labs: Recent Labs  Lab 10/03/20 2010 10/03/20 2249 10/03/20 2250  PROCALCITON  --   --  <0.10  LATICACIDVEN 4.2* 2.8*  --     Recent Results (from the past 240 hour(s))  Culture, blood (Routine x 2)     Status: None (Preliminary result)   Collection Time: 10/03/20  8:09 PM   Specimen: BLOOD LEFT HAND  Result Value Ref Range Status   Specimen Description BLOOD LEFT HAND  Final   Special Requests   Final    BOTTLES DRAWN AEROBIC ONLY Blood Culture results may not be optimal due to an inadequate volume of blood received in culture bottles   Culture   Final    NO GROWTH < 12 HOURS Performed at [facility], [address], [place], [place] [address]    Report Status PENDING  Incomplete  Resp Panel by RT-PCR (Flu A&B, Covid) Nasopharyngeal Swab     Status: Abnormal   Collection Time: 10/03/20  8:15 PM   Specimen: Nasopharyngeal Swab; Nasopharyngeal(NP) swabs in vial transport medium  Result Value Ref Range Status   SARS Coronavirus 2 by RT PCR POSITIVE (A) NEGATIVE Final    Comment: RESULT CALLED TO, READ BACK BY AND VERIFIED WITH: RN ALEXIS DAVIDSON BY MESSAN H. AT 2155 ON 10/03/2020 (NOTE) SARS-CoV-2 target nucleic acids are DETECTED.  The SARS-CoV-2 RNA is generally detectable in upper respiratory specimens during the acute phase of  infection. Positive results are indicative of the presence of the identified virus, but do not rule out bacterial infection or co-infection with other pathogens not detected by the test. Clinical correlation with patient history and other diagnostic information is necessary to determine patient infection status. The expected result is Negative.  Fact Sheet for Patients: BloggerCourse.com  Fact Sheet for Healthcare Providers: SeriousBroker.it  This test is not yet approved or cleared by the [place] FDA and  has been authorized for detection and/or diagnosis of  SARS-CoV-2 by FDA under an Emergency Use Authorization (EUA).  This EUA will remain in effect (meanin g this test can be used) for the duration of  the COVID-19 declaration under Section 564(b)(1) of the Act, 21 U.S.C. section 360bbb-3(b)(1), unless the authorization is terminated or revoked sooner.     Influenza A by PCR NEGATIVE NEGATIVE Final   Influenza B by PCR NEGATIVE NEGATIVE Final    Comment: (NOTE) The Xpert Xpress SARS-CoV-2/FLU/RSV plus assay is intended as an aid in the diagnosis of influenza from Nasopharyngeal swab specimens and should not be used as a sole basis for treatment. Nasal washings and aspirates are unacceptable for Xpert Xpress SARS-CoV-2/FLU/RSV testing.  Fact Sheet for Patients: BloggerCourse.com  Fact Sheet for Healthcare Providers: SeriousBroker.it  This test is not yet approved or cleared by the [place] FDA and has been authorized for detection and/or diagnosis of SARS-CoV-2 by FDA under an Emergency Use Authorization (EUA). This EUA will remain in effect (meaning this test can be used) for the duration of the COVID-19 declaration under Section 564(b)(1) of the Act, 21 U.S.C. section 360bbb-3(b)(1), unless the authorization is terminated or revoked.  Performed at [facility], [address], [place], [place] [address]   Culture, blood (Routine x 2)     Status: None (Preliminary result)   Collection Time: 10/03/20  8:19 PM   Specimen: BLOOD RIGHT FOREARM  Result Value Ref Range Status   Specimen Description BLOOD RIGHT FOREARM  Final   Special Requests   Final    BOTTLES DRAWN AEROBIC AND ANAEROBIC Blood Culture adequate volume   Culture   Final    NO GROWTH < 12 HOURS Performed at [facility], [address] [place], [place] [address]    Report Status PENDING  Incomplete         Radiology Studies: CT ABDOMEN PELVIS W CONTRAST  Result Date: 10/04/2020 CLINICAL DATA:  80 year old male with concern for abdominal abscess or infection. EXAM: CT ABDOMEN AND PELVIS WITH CONTRAST TECHNIQUE: Multidetector CT imaging of the abdomen and pelvis was performed using the standard protocol following bolus administration of intravenous contrast. CONTRAST:  59mL OMNIPAQUE IOHEXOL 300 MG/ML  SOLN COMPARISON:  None. FINDINGS: Lower chest: Scattered bibasilar ground-glass opacities and consolidative., Most prominent in the dependent lower lobes. No pleural effusions. The heart is normal in size. No pericardial effusion. Hepatobiliary: The liver is normal in size, contour, and attenuation. No intra or extrahepatic biliary ductal dilation. The gallbladder is present and unremarkable. Pancreas: Atrophic, not well visualized. Spleen: Normal in size without focal abnormality. Adrenals/Urinary Tract: Adrenal glands are unremarkable. Few scattered simple cortical cyst in the left kidney, largest in the inferior pole measuring up to 5 cm. Kidneys are otherwise normal, without renal calculi, focal lesion, or hydronephrosis. Bladder is unremarkable. Stomach/Bowel: Gastrostomy tube in place with the retention balloon along the posterior wall of the gastric body. The majority of the small bowel in the left hemiabdomen, decompressed. Large colonic stool burden with diffuse mild  distension. The appendix is not definitively visualized. Vascular/Lymphatic: No significant vascular findings are present. No enlarged abdominal or pelvic lymph nodes. Reproductive: Prostatomegaly, measuring up to 6.1 cm in axial dimension, with protrusion into the base of the bladder. Other: No abdominal wall hernia or abnormality. No abdominopelvic ascites. Musculoskeletal: Multilevel degenerative changes of the thoracolumbar spine associated sigmoid curvature. No evidence of acute fracture IMPRESSION: 1. Scattered bibasilar, peripheral predominant independent ground-glass and consolidative opacities as could be seen with  multi focal pneumonia. 2. No acute intra-abdominal abnormality. 3. Gastrostomy tube in place with the retention balloon along the posterior wall of the gastric body. If the tube is malfunctioning or leaking along the track, consider retraction of the gastrostomy tube to position the retention balloon along the anterior aspect of the stomach followed by cinching the external bumper down to the level of the skin. Marliss Coots, MD [facility]iology Electronically Signed   By: Marliss Coots MD   On: 10/04/2020 09:21   DG Chest Portable 1 View  Result Date: 10/03/2020 CLINICAL DATA:  Infection. Increased weakness and cough for 2 days. Fever. EXAM: PORTABLE CHEST 1 VIEW COMPARISON:  None. FINDINGS: Lung volumes are low. No focal airspace disease. Normal heart size. Mild aortic tortuosity. Otherwise normal mediastinal contours. No pulmonary edema, pleural fluid or pneumothorax. Multiple skin folds project over the chest. Scoliotic curvature of the spine. No acute osseous abnormalities are seen. IMPRESSION: Low lung volumes without acute abnormality. Electronically Signed   By: Narda Rutherford M.D.   On: 10/03/2020 21:09        Scheduled Meds: . enoxaparin (LOVENOX) injection  40 mg Subcutaneous Q24H  . insulin aspart  0-9 Units  Subcutaneous Q4H   Continuous Infusions: . dextrose 5 % and 0.9% NaCl 75 mL/hr at 10/04/20 0945  . remdesivir 100 mg in NS 100 mL       LOS: 1 day    Time spent: 42 minutes spent on chart review, discussion with nursing staff, consultants, updating family and interview/physical exam; more than 50% of that time was spent in counseling and/or coordination of care.    Alvira Philips [place], DO [facility] Available via Epic secure chat 7am-7pm After these hours, please refer to coverage provider listed on [email or web address] 10/04/2020, 10:26 AM

## 2020-10-04 NOTE — H&P (Signed)
History and Physical    Justin Hawkins EYC:[id number] DOB: May 11, 1941 DOA: 10/03/2020  PCP: [facility]  Patient coming from: Home.  History obtained from patient's daughter-in-law.  Patient has dementia.  Chief Complaint: Cough cold symptoms not eating well.  HPI: Justin Hawkins is a 80 y.o. male with history of dementia, diabetes mellitus, hypertension previous stroke was brought to the ER after patient has not been eating well for the last 2 days has been having cold symptoms with persistent cough.  Patient has a PEG tube but as per the patient's daughter-in-law he does eat the use PEG tube only when he does not eat anything.  Did not have any nausea vomiting diarrhea chest pain or shortness of breath.  Did have some incontinence of urine.  And was not acting himself.  Given these changes he was brought to the ER.  ED Course: In the ER patient had a temperature of 99.2 with blood pressure 99 systolic with elevated lactic acid of 4.2.  Hemoglobin is 12.1 sodium 127 albumin 2.9.  On exam patient has inguinal hernia which is nonreducible.  Chest x-ray does not show any infiltrates.  Covid test is positive.  Patient was started on fluid bolus for possible sepsis following which lactic acid improved.  Patient mental status also improved.  He is oriented to his name at the time of my exam.  Moving all extremities but weak all over.  UA is unremarkable.  Patient admitted for SIRS and Covid infection.  Review of Systems: As per HPI, rest all negative.   Past Medical History:  Diagnosis Date  . Dementia (HCC)   . Diabetes mellitus without complication (HCC)   . Stroke [facility])     Past Surgical History:  Procedure Laterality Date  . IR REPLC GASTRO/COLONIC TUBE PERCUT W/FLUORO  10/08/2018     reports that he has never smoked. He has never used smokeless tobacco. He reports current alcohol use. He reports that he does not use drugs.  No Known Allergies  Family History  Family  history unknown: Yes    Prior to Admission medications   Not on File    Physical Exam: Constitutional: Moderately built and nourished. Vitals:   10/04/20 0130 10/04/20 0245 10/04/20 0300 10/04/20 0315  BP: 113/84 118/71 109/68 112/73  Pulse: 95 91 91 90  Resp: 13 13 17 19   Temp:      TempSrc:      SpO2: 95% 98% 98% 97%   Eyes: Anicteric no pallor. ENMT: No discharge from the ears eyes nose or mouth. Neck: No mass felt.  No neck rigidity. Respiratory: No rhonchi or crepitations. Cardiovascular: S1-S2 heard. Abdomen: Soft nontender right-sided inguinal hernia seen not reducible. Musculoskeletal: No edema. Skin: No rash. Neurologic: Alert awake oriented to his name.  Moving all extremities generally weak. Psychiatric: Oriented to his name.   Labs on Admission: I have personally reviewed following labs and imaging studies  CBC: Recent Labs  Lab 10/03/20 2010  WBC 3.3*  NEUTROABS 2.5  HGB 12.1*  HCT 35.2*  MCV 85.6  PLT 185   Basic Metabolic Panel: Recent Labs  Lab 10/03/20 2010  NA 127*  K 4.4  CL 91*  CO2 24  GLUCOSE 165*  BUN 25*  CREATININE 0.83  CALCIUM 9.7   GFR: CrCl cannot be calculated (Unknown ideal weight.). Liver Function Tests: Recent Labs  Lab 10/03/20 2010  AST 28  ALT 22  ALKPHOS 66  BILITOT 0.4  PROT 5.9*  ALBUMIN 2.9*   No results for input(s): LIPASE, AMYLASE in the last 168 hours. No results for input(s): AMMONIA in the last 168 hours. Coagulation Profile: Recent Labs  Lab 10/03/20 2010  INR 0.9   Cardiac Enzymes: No results for input(s): CKTOTAL, CKMB, CKMBINDEX, TROPONINI in the last 168 hours. BNP (last 3 results) No results for input(s): PROBNP in the last 8760 hours. HbA1C: No results for input(s): HGBA1C in the last 72 hours. CBG: No results for input(s): GLUCAP in the last 168 hours. Lipid Profile: Recent Labs    10/03/20 2250  TRIG 55   Thyroid Function Tests: Recent Labs    10/03/20 2010  10/03/20 2249  TSH  --  0.901  FREET4 1.37*  --    Anemia Panel: Recent Labs    10/03/20 2250  FERRITIN 226   Urine analysis: No results found for: COLORURINE, APPEARANCEUR, LABSPEC, PHURINE, GLUCOSEU, HGBUR, BILIRUBINUR, KETONESUR, PROTEINUR, UROBILINOGEN, NITRITE, LEUKOCYTESUR Sepsis Labs: @LABRCNTIP (procalcitonin:4,lacticidven:4) ) Recent Results (from the past 240 hour(s))  Resp Panel by RT-PCR (Flu A&B, Covid) Nasopharyngeal Swab     Status: Abnormal   Collection Time: 10/03/20  8:15 PM   Specimen: Nasopharyngeal Swab; Nasopharyngeal(NP) swabs in vial transport medium  Result Value Ref Range Status   SARS Coronavirus 2 by RT PCR POSITIVE (A) NEGATIVE Final    Comment: RESULT CALLED TO, READ BACK BY AND VERIFIED WITH: RN ALEXIS DAVIDSON BY MESSAN H. AT 2155 ON 10/03/2020 (NOTE) SARS-CoV-2 target nucleic acids are DETECTED.  The SARS-CoV-2 RNA is generally detectable in upper respiratory specimens during the acute phase of infection. Positive results are indicative of the presence of the identified virus, but do not rule out bacterial infection or co-infection with other pathogens not detected by the test. Clinical correlation with patient history and other diagnostic information is necessary to determine patient infection status. The expected result is Negative.  Fact Sheet for Patients: 12/01/2020  Fact Sheet for Healthcare Providers: BloggerCourse.com  This test is not yet approved or cleared by the [employer]iousBroker.[place] FDA and  has been authorized for detection and/or diagnosis of SARS-CoV-2 by FDA under an Emergency Use Authorization (EUA).  This EUA will remain in effect (meanin g this test can be used) for the duration of  the COVID-19 declaration under Section 564(b)(1) of the Act, 21 U.S.C. section 360bbb-3(b)(1), unless the authorization is terminated or revoked sooner.     Influenza A by PCR NEGATIVE  NEGATIVE Final   Influenza B by PCR NEGATIVE NEGATIVE Final    Comment: (NOTE) The Xpert Xpress SARS-CoV-2/FLU/RSV plus assay is intended as an aid in the diagnosis of influenza from Nasopharyngeal swab specimens and should not be used as a sole basis for treatment. Nasal washings and aspirates are unacceptable for Xpert Xpress SARS-CoV-2/FLU/RSV testing.  Fact Sheet for Patients: [place]  Fact Sheet for Healthcare Providers: BloggerCourse.com  This test is not yet approved or cleared by the SeriousBroker.it FDA and has been authorized for detection and/or diagnosis of SARS-CoV-2 by FDA under an Emergency Use Authorization (EUA). This EUA will remain in effect (meaning this test can be used) for the duration of the COVID-19 declaration under Section 564(b)(1) of the Act, 21 U.S.C. section 360bbb-3(b)(1), unless the authorization is terminated or revoked.  Performed at [facility], [address], [place], [place]      Radiological Exams on Admission: DG Chest Portable 1 View  Result Date: 10/03/2020 CLINICAL DATA:  Infection. Increased weakness and cough for 2 days.  Fever. EXAM: PORTABLE CHEST 1 VIEW COMPARISON:  None. FINDINGS: Lung volumes are low. No focal airspace disease. Normal heart size. Mild aortic tortuosity. Otherwise normal mediastinal contours. No pulmonary edema, pleural fluid or pneumothorax. Multiple skin folds project over the chest. Scoliotic curvature of the spine. No acute osseous abnormalities are seen. IMPRESSION: Low lung volumes without acute abnormality. Electronically Signed   By: Narda Rutherford M.D.   On: 10/03/2020 21:09     Assessment/Plan Principal Problem:   SIRS (systemic inflammatory response syndrome) (HCC) Active Problems:   COVID-19 virus infection   Normocytic normochromic anemia    1. SIRS source not clear since patient was having productive cough ER physician  placed patient on empiric Zosyn for possible aspiration and obtain blood cultures and fluids were started.  Patient's Covid infection could also be contributing to some of the symptoms.  Since patient does have a hernia which is difficult to reduce we will get a CT abdomen pelvis.  We will keep patient n.p.o. for now we will get speech therapy evaluation. 2. COVID-19 infection with patient not being hypoxic and chest x-ray does not show any definite infiltrates.  We will keep patient on remdesivir for now. 3. History of diabetes mellitus type 2 we will keep patient on sliding scale coverage. 4. History of hypertension we will keep patient on as needed IV hydralazine. 5. History of dementia will need to verify home medications. 6. Anemia follow CBC. 7. Patient has a PEG tube which is only used if patient does not eat well orally.  This was information provided by the daughter-in-law. 8. Hyponatremia could be from dehydration.  Follow metabolic panel after hydration.  No old labs to compare.  Given the SIRS picture with Covid infection with elevated lactic acid levels will need close monitoring for any further worsening in inpatient status.  Since needs to be verified.   DVT prophylaxis: Lovenox. Code Status: Full code confirmed with patient's daughter in law. Family Communication: Daughter-in-law. Disposition Plan: Home when stable. Consults called: None. Admission status: Inpatient.   Eduard Clos MD [facility] Pager [phone].  If 7PM-7AM, please contact night-coverage [email or web address] Password [facility]  10/04/2020, 3:32 AM

## 2020-10-04 NOTE — ED Notes (Signed)
Page sent to provider Dr. [place] about CBG, message also sent in secure chat

## 2020-10-04 NOTE — ED Notes (Signed)
Pt has given permission for this RN to speak with family.

## 2020-10-04 NOTE — Consult Note (Signed)
Consultation Note Date: 10/04/2020   Patient Name: Justin Hawkins  DOB: 05-05-1941  MRN: [id number]  Age / Sex: 80 y.o., male  PCP: [facility] Referring Physician: [place], Eric J, DO  Reason for Consultation: Establishing goals of care  HPI/Patient Profile: 80 y.o. male  with past medical history of stroke, DM, and dementia presented to the ED on 10/03/20 from home with family concerns of increased weakness, productive cough, incontinence, and poor PO intake. Patient has PEG that family utilizes when they feel patient is not eating/drinking enough. Patient was admitted on 10/03/2020 with COVID19 infection, failure to thrive, constipation, and severe protein calorie malnutrition (Albumin 2.9).   Patient and family face treatment option decisions, advanced directive decisions, and anticipatory care needs.   Clinical Assessment and Goals of Care: I have reviewed medical records including EPIC notes, labs, and imaging. Received report from primary RN - no acute concerns. RN reports patient is not verbally responsive and is not able to follow commands.   Went to visit patient at bedside - no family/visitors present. Patient was lying in bed asleep. No signs or non-verbal gestures of pain or discomfort noted. No respiratory distress, increased work of breathing, or secretions noted.   Called patient's son/Justin Hawkins to discuss diagnosis, prognosis, GOC, EOL wishes, disposition, and options. His wife/Justin Hawkins was also present for conversation via speaker phone.  I introduced Palliative Medicine as specialized medical care for people living with serious illness. It focuses on providing relief from the symptoms and stress of a serious illness. The goal is to improve quality of life for both the patient and the family.  We discussed a brief life review of the patient as well as functional and nutritional status. The  patient is originally from [facility] but lived most of his life in [place]. The patient is widowed - they had 3 daughters and 1 son. When the patient's heath started to decline 4 years ago, Justin Hawkins moved the patient to [place] to live with him and his wife. Justin Hawkins states the patient's daughters/his sisters are not close with the patient and are not very involved in his care. Prior to hospitalization, the patient was living in a private residence with his son and daughter-in-law - they were his primary caregivers. The patient was able to walk with assistance and dress himself, but did need help bathing. The family noticed a decline in the patient's mental status last Saturday, stating that he stopped swallowing and would hold food in his mouth; he also became incontinent. The family states that the patient normally takes food/liquid by mouth, but when they feel he has not been eating/drinking enough they will give the patient water and ensure through his PEG tube.   We discussed patient's current illness and what it means in the larger context of patient's on-going co-morbidities. The family understands the patient's current medical situation, but seem resistant to accept it. Education provided that dementia is a progressive, non-curable disease underlying the patient's current acute medical conditions. We discussed the patient's frailty  and high risk of decline/rehospitalization. We also discussed his malnutrition and how it seems the patient has not been eating enough to sustain his body long term - it seemed his malnutrition has been going longer than the last several days per his Albumin - labs reviewed with family. Family state that the patient is "stubborn" and is picky when it comes to food. Reviewed common signs of dementia and it's trajectory. Discussed that his dementia most likely was progressing before this illness; however, with acute illnesses such as COVID, it could possibly cause the dementia to  progress faster. Expressed concern that due to his frailty and malnutrition his body does not have much reserve to fight acute illnesses. Natural disease trajectory and expectations at EOL were discussed. I attempted to elicit values and goals of care important to the patient. The difference between aggressive medical intervention and comfort care was considered in light of the patient's goals of care. The family states that the patient was hospitalized several years ago and felt the medical team "gave up" on the patient and stated that the patient made a full recovery at that time. They expressed worry we are "giving up" on the patient. Provided therapeutic listening and emotional support - gave assurance to family that we were doing everything we could to help the patient recover. Explained that medicine/medical care can only go so far and then it's up to our bodies and what it can handle. Family expressed understanding and were appreciative to hear we were providing recommended interventions. Family would like watchful waiting at this time. Family is not open to comfort care but if the patient declines, or shows no improvement, are then open to further GOC conversations around comfort measures. We discussed quality of life and what a meaningful recovery would look like for the patient. Family states if the patient seems as though he would not be able to have the meaningful recovery or what they consider a "good" quality of life, they are open to conversations around comfort measures.  Advance directives and concepts specific to code status were considered and discussed. Justin FavreClarence states he is the patient's HCPOA - requested a copy of the document if he was able to provide. Encouraged patient/family to consider DNR/DNI status understanding evidenced based poor outcomes in similar hospitalized patient, as the cause of arrest is likely associated with advanced chronic/terminal illness rather than an easily  reversible acute cardio-pulmonary event. Family were not agreeable to DNR/DNI with understanding that the patient would receive CPR, defibrillation, ACLS medications, or intubation. Reviewed that changing code status to DNR/DNI does not mean we would stop medical interventions. I explained that DNR does not change the medical plan and it only comes into effect after a person has arrested (died).  It is a protective measure to keep us from harming the patient in their last moments of life. Family expressed understanding and stated they would still want the patient to be full code.   Discussed with family the importance of continued conversation with each other and the medical providers regarding overall plan of care and treatment options, ensuring decisions are within the context of the patient's values and GOCs.    Questions and concerns were addressed. The patient/family was encouraged to call with questions and/or concerns. PMT number was provided.   Primary Decision Maker: NEXT OF KIN - Justin Hawkins Simmers/son - states he has HCPOA paperwork, need to obtain copy    SUMMARY OF RECOMMENDATIONS:  Continue current medical treatment  Continue full code  status   Family would like watchful waiting with time for outcomes - if patient declines or shows no improvement they are open to further GOC conversations around comfort care  Family is hopeful the patient will be able to improve enough to return home; son and daughter-in-law have no concerns about being able to continue caring for the patient at home  Family states they do not want medical team to "give up" on the patient  Ongoing PMT discussions pending clinical course  Requested Justin Hawkins provide HCPOA paperwork - please make copy for our records if able  PMT will continue to follow holistically   Code Status/Advance Care Planning:  Full code  Palliative Prophylaxis:   Aspiration, Bowel Regimen, Delirium Protocol, Frequent Pain  Assessment, Oral Care and Turn Reposition  Additional Recommendations (Limitations, Scope, Preferences):  Full Scope Treatment  Psycho-social/Spiritual:   Desire for further Chaplaincy support:no Created space and opportunity for patient and family to express thoughts and feelings regarding patient's current medical situation.   Emotional support and therapeutic listening provided.  Prognosis:   Unable to determine  Discharge Planning: To Be Determined      Primary Diagnoses: Present on Admission: . SIRS (systemic inflammatory response syndrome) (HCC) . COVID-19 virus infection . Normocytic normochromic anemia   I have reviewed the medical record, interviewed the patient and family, and examined the patient. The following aspects are pertinent.  Past Medical History:  Diagnosis Date  . Dementia (HCC)   . Diabetes mellitus without complication (HCC)   . Stroke [facility])    Social History   Socioeconomic History  . Marital status: Widowed    Spouse name: Not on file  . Number of children: Not on file  . Years of education: Not on file  . Highest education level: Not on file  Occupational History  . Not on file  Tobacco Use  . Smoking status: Never Smoker  . Smokeless tobacco: Never Used  Substance and Sexual Activity  . Alcohol use: Yes  . Drug use: Never  . Sexual activity: Not on file  Other Topics Concern  . Not on file  Social History Narrative  . Not on file   Social Determinants of Health   Financial Resource Strain: Not on file  Food Insecurity: Not on file  Transportation Needs: Not on file  Physical Activity: Not on file  Stress: Not on file  Social Connections: Not on file   Family History  Family history unknown: Yes   Scheduled Meds: . bisacodyl  10 mg Rectal Once  . enoxaparin (LOVENOX) injection  40 mg Subcutaneous Q24H  . insulin aspart  0-6 Units Subcutaneous TID WC  . polyethylene glycol  17 g Per Tube Daily   Continuous  Infusions: . dextrose 5 % and 0.9% NaCl 75 mL/hr at 10/04/20 0945  . remdesivir 100 mg in NS 100 mL     PRN Meds:.acetaminophen **OR** acetaminophen, hydrALAZINE Medications Prior to Admission:  Prior to Admission medications   Medication Sig Start Date End Date Taking? Authorizing Provider  atorvastatin (LIPITOR) 10 MG tablet Take 10 mg by mouth at bedtime. 06/06/20  Yes [provider]  cyproheptadine (PERIACTIN) 4 MG tablet Take 4 mg by mouth 2 (two) times daily.   Yes [provider]  metFORMIN (GLUCOPHAGE) 1000 MG tablet Take 1,000 mg by mouth daily. 06/07/20  Yes [provider]  mirtazapine (REMERON) 30 MG tablet Take 30 mg by mouth at bedtime. 09/06/20  Yes [provider]   No  Known Allergies Review of Systems  Unable to perform ROS: Acuity of condition    Physical Exam Vitals and nursing note reviewed.  Constitutional:      General: He is not in acute distress.    Appearance: He is ill-appearing.  Pulmonary:     Effort: No respiratory distress.  Skin:    General: Skin is warm and dry.  Neurological:     Mental Status: He is lethargic.  Psychiatric:        Speech: He is noncommunicative.     Vital Signs: BP 119/86   Pulse 89   Temp 98.6 F (37 C) (Oral)   Resp 15   SpO2 98%  Pain Scale: 0-10   Pain Score: 0-No pain   SpO2: SpO2: 98 % O2 Device:SpO2: 98 % O2 Flow Rate: .   IO: Intake/output summary:   Intake/Output Summary (Last 24 hours) at 10/04/2020 1807 Last data filed at 10/04/2020 0940 Gross per 24 hour  Intake 600 ml  Output -  Net 600 ml    LBM:   Baseline Weight:   Most recent weight:       Palliative Assessment/Data:PPS 20-30% with PEG    Time In: 1815 Time Out: 1930 Time Total: 75 minutes  Greater than 50%  of this time was spent counseling and coordinating care related to the above assessment and plan.  Signed by: Haskel Khan, NP   Please contact Palliative Medicine Team phone at  [phone] for questions and concerns.  For individual provider: See Loretha Stapler

## 2020-10-05 DIAGNOSIS — R6510 Systemic inflammatory response syndrome (SIRS) of non-infectious origin without acute organ dysfunction: Secondary | ICD-10-CM | POA: Diagnosis not present

## 2020-10-05 LAB — CBC WITH DIFFERENTIAL/PLATELET
Abs Immature Granulocytes: 0.03 10*3/uL (ref 0.00–0.07)
Basophils Absolute: 0 10*3/uL (ref 0.0–0.1)
Basophils Relative: 0 %
Eosinophils Absolute: 0 10*3/uL (ref 0.0–0.5)
Eosinophils Relative: 0 %
HCT: 32.7 % — ABNORMAL LOW (ref 39.0–52.0)
Hemoglobin: 10.7 g/dL — ABNORMAL LOW (ref 13.0–17.0)
Immature Granulocytes: 0 %
Lymphocytes Relative: 10 %
Lymphs Abs: 0.8 10*3/uL (ref 0.7–4.0)
MCH: 29 pg (ref 26.0–34.0)
MCHC: 32.7 g/dL (ref 30.0–36.0)
MCV: 88.6 fL (ref 80.0–100.0)
Monocytes Absolute: 0.3 10*3/uL (ref 0.1–1.0)
Monocytes Relative: 4 %
Neutro Abs: 6.5 10*3/uL (ref 1.7–7.7)
Neutrophils Relative %: 86 %
Platelets: 169 10*3/uL (ref 150–400)
RBC: 3.69 MIL/uL — ABNORMAL LOW (ref 4.22–5.81)
RDW: 12.6 % (ref 11.5–15.5)
WBC: 7.7 10*3/uL (ref 4.0–10.5)
nRBC: 0 % (ref 0.0–0.2)

## 2020-10-05 LAB — COMPREHENSIVE METABOLIC PANEL
ALT: 17 U/L (ref 0–44)
AST: 21 U/L (ref 15–41)
Albumin: 2.3 g/dL — ABNORMAL LOW (ref 3.5–5.0)
Alkaline Phosphatase: 56 U/L (ref 38–126)
Anion gap: 8 (ref 5–15)
BUN: 11 mg/dL (ref 8–23)
CO2: 26 mmol/L (ref 22–32)
Calcium: 9.3 mg/dL (ref 8.9–10.3)
Chloride: 99 mmol/L (ref 98–111)
Creatinine, Ser: 0.61 mg/dL (ref 0.61–1.24)
GFR, Estimated: >60 mL/min (ref >60)
Glucose, Bld: 163 mg/dL — ABNORMAL HIGH (ref 70–99)
Potassium: 3.4 mmol/L — ABNORMAL LOW (ref 3.5–5.1)
Sodium: 133 mmol/L — ABNORMAL LOW (ref 135–145)
Total Bilirubin: 0.3 mg/dL (ref 0.3–1.2)
Total Protein: 4.7 g/dL — ABNORMAL LOW (ref 6.5–8.1)

## 2020-10-05 LAB — CBG MONITORING, ED
Glucose-Capillary: 128 mg/dL — ABNORMAL HIGH (ref 70–99)
Glucose-Capillary: 135 mg/dL — ABNORMAL HIGH (ref 70–99)
Glucose-Capillary: 133 mg/dL — ABNORMAL HIGH (ref 70–99)
Glucose-Capillary: 156 mg/dL — ABNORMAL HIGH (ref 70–99)
Glucose-Capillary: 158 mg/dL — ABNORMAL HIGH (ref 70–99)
Glucose-Capillary: 160 mg/dL — ABNORMAL HIGH (ref 70–99)
Glucose-Capillary: 154 mg/dL — ABNORMAL HIGH (ref 70–99)

## 2020-10-05 LAB — C-REACTIVE PROTEIN: CRP: 9.3 mg/dL — ABNORMAL HIGH (ref <1.0)

## 2020-10-05 LAB — D-DIMER, QUANTITATIVE: D-Dimer, Quant: 0.43 ug/mL-FEU (ref 0.00–0.50)

## 2020-10-05 MED ORDER — POTASSIUM CHLORIDE 20 MEQ PO PACK
40.0000 meq | PACK | Freq: Once | ORAL | Status: AC
  Administered 2020-10-05: 40 meq
  Filled 2020-10-05: qty 2

## 2020-10-05 MED ORDER — POLYETHYLENE GLYCOL 3350 17 G PO PACK: 17.0000 g | PACK | Freq: Every day | ORAL | Status: DC | PRN

## 2020-10-05 MED ORDER — SENNOSIDES-DOCUSATE SODIUM 8.6-50 MG PO TABS
1.0000 | ORAL_TABLET | Freq: Two times a day (BID) | ORAL | Status: DC
  Administered 2020-10-05 – 2020-10-07 (×5): 1 via ORAL
  Filled 2020-10-05 (×5): qty 1

## 2020-10-05 MED ORDER — SORBITOL 70 % SOLN
960.0000 mL | TOPICAL_OIL | Freq: Once | ORAL | Status: AC
  Administered 2020-10-05: 500 mL via RECTAL
  Filled 2020-10-05: qty 473

## 2020-10-05 NOTE — ED Notes (Signed)
Lunch Tray Ordered @ 1715. 

## 2020-10-05 NOTE — Evaluation (Signed)
e Clinical/Bedside Swallow Evaluation Patient Details  Name: Justin Hawkins MRN: [id number] Date of Birth: 28-Dec-1940  Today's Date: 10/05/2020 Time: SLP Start Time (ACUTE ONLY): 1012 SLP Stop Time (ACUTE ONLY): 1028 SLP Time Calculation (min) (ACUTE ONLY): 16 min  Past Medical History:  Past Medical History:  Diagnosis Date  . Dementia (HCC)   . Diabetes mellitus without complication (HCC)   . Stroke [facility])    Past Surgical History:  Past Surgical History:  Procedure Laterality Date  . IR REPLC GASTRO/COLONIC TUBE PERCUT W/FLUORO  10/08/2018   HPI:  80 y.o. male with history of dementia, diabetes mellitus, hypertension previous stroke was brought to the ER after patient has not been eating well for the last 2 days has been having cold symptoms with persistent cough.  Patient has a PEG tube but as per the patient's daughter-in-law he does eat the use PEG tube only when he does not eat anything.  Did not have any nausea vomiting diarrhea chest pain or shortness of breath.  Did have some incontinence of urine.  And was not acting himself, found to have COVID-19.   Assessment / Plan / Recommendation Clinical Impression  Pt presents with a a suspected mild oropharyngeal dysphagia. Pt with missing dentition. Per chart review pt with PEG for over 2 years, utilizing when PO consumption is reduced, though has maintained PO consumption. No family/caregivers at bedside. Pt with prolonged mastication and reduced bolus cohesion with solid PO. Swallow initiation per palpation appeared delayed however vocal quality remained clear. No overt s/sx of aspiration includling during 3 oz water challenge. Pt with baseline cough, not observed during PO consumption. Recommend dysphagia 3 (mechanical soft) and thin liquids with medicines whole in puree. No further ST needs identified.  SLP Visit Diagnosis: Dysphagia, unspecified (R13.10);Dysphagia, oral phase (R13.11)    Aspiration Risk  Mild aspiration  risk;Moderate aspiration risk    Diet Recommendation   Dysphagia 3 (mechanical soft) thin liquids   Medication Administration: Whole meds with puree    Other  Recommendations Oral Care Recommendations: Oral care BID   Follow up Recommendations 24 hour supervision/assistance;Skilled Nursing facility      Frequency and Duration            Prognosis Prognosis for Safe Diet Advancement: Fair Barriers to Reach Goals: Cognitive deficits;Time post onset      Swallow Study   General Date of Onset: 10/04/20 HPI: 80 y.o. male with history of dementia, diabetes mellitus, hypertension previous stroke was brought to the ER after patient has not been eating well for the last 2 days has been having cold symptoms with persistent cough.  Patient has a PEG tube but as per the patient's daughter-in-law he does eat the use PEG tube only when he does not eat anything.  Did not have any nausea vomiting diarrhea chest pain or shortness of breath.  Did have some incontinence of urine.  And was not acting himself, found to have COVID-19. Type of Study: Bedside Swallow Evaluation Previous Swallow Assessment: none on file Diet Prior to this Study: NPO Temperature Spikes Noted: No Respiratory Status: Room air History of Recent Intubation: No Behavior/Cognition: Alert;Confused;Requires cueing Oral Cavity Assessment: Within Functional Limits Oral Cavity - Dentition: Missing dentition;Poor condition Vision: Functional for self-feeding Self-Feeding Abilities: Needs assist Patient Positioning: Upright in bed Baseline Vocal Quality: Low vocal intensity Volitional Cough: Cognitively unable to elicit Volitional Swallow: Able to elicit    Oral/Motor/Sensory Function Overall Oral Motor/Sensory Function: Generalized oral weakness   Ice  Chips Ice chips: Impaired Presentation: Spoon Oral Phase Functional Implications: Prolonged oral transit Pharyngeal Phase Impairments: Suspected delayed Swallow;Multiple swallows    Thin Liquid Thin Liquid: Impaired Presentation: Cup;Straw Oral Phase Functional Implications: Prolonged oral transit Pharyngeal  Phase Impairments: Suspected delayed Swallow;Multiple swallows    Nectar Thick Nectar Thick Liquid: Not tested   Honey Thick Honey Thick Liquid: Not tested   Puree Puree: Within functional limits Presentation: Spoon   Solid     Solid: Impaired Presentation: Self Fed Oral Phase Impairments: Impaired mastication;Reduced lingual movement/coordination Oral Phase Functional Implications: Prolonged oral transit Pharyngeal Phase Impairments: Suspected delayed Swallow;Multiple swallows      Rosa Gambale E Clarrisa Kaylor MA, CCC-SLP Acute Rehabilitation Services  10/05/2020,10:49 AM

## 2020-10-05 NOTE — ED Notes (Signed)
Pt had extremely large BM following SMOG enema. Pt was cleaned and placed on new clean sheets. Also replaced condom cath, peri care done.

## 2020-10-05 NOTE — ED Notes (Signed)
Linen and brief changed. Condom cath changed and peri-care performed.

## 2020-10-05 NOTE — Progress Notes (Signed)
PROGRESS NOTE    Justin Hawkins  WER:[id number] DOB: [birth year] DOA: 10/03/2020 PCP: [facility]    Brief Narrative:  Justin Hawkins is a 80 year-old male with past medical history significant for dementia, type 2 diabetes mellitus, essential hypertension, previous CVA who was brought to the emergency department after patient has not been eating well over the last 2 days with cold symptoms with persistent cough.  Patient with a PEG tube, family states does not usually use the PEG for nutrition, only when he does not eat anything.  No reported nausea, vomiting, diarrhea, chest pain or shortness of breath.  Patient apparently not "acting like himself" which is a change and thus he was brought to the ED for further evaluation.  In the ED, temperature 98.4, HR 104, RR 24, BP 141/79, SPO2 98% on room air.  Sodium 127, potassium 4.4, chloride 91, CO2 24, glucose 165, BUN 25, creatinine 0.83, AST 28, ALT 22, total bilirubin 0.4.  WBC 3.3, hemoglobin 12.1, platelet 185.  Troponin 45>10.  Lactic acid 4.2>2.8.  INR 0.9.  TSH 0.901, free T4 1.37.  Covid-19 PCR positive.  Influenza A/B PCR negative.  Chest x-ray with low lung volumes, otherwise no acute cardiopulmonary disease process.  Patient was started on Zosyn by ED physician for concern of underlying infectious etiology.  Hospitalist service consulted for further evaluation and treatment adult failure to thrive, and COVID-19 viral infection.   Assessment & Plan:   Principal Problem:   SIRS (systemic inflammatory response syndrome) (HCC) Active Problems:   COVID-19 virus infection   Normocytic normochromic anemia   SIRS, POA Acute Covid-19 viral infection during the ongoing Covid 19 Pandemic - POA Patient presenting from home with poor oral intake, failure to thrive.  Was found to be incidentally positive for COVID-19 PCR.  Son reports that patient is vaccinated, has received 2 doses of the Pfizer vaccine, but has not received a booster  as son reports it is not that time as of yet.  WBC and procalcitonin within normal limits, discontinued Zosyn.  Completed 3-day course of remdesivir on 10/05/2020. --COVID test: + PCR 1/9 --CRP 2.2>9.3 --ddimer 0.43>0.43 -- Currently oxygenating well on room air --Continue supportive care with albuterol MDI prn, vitamin C, zinc, Tylenol, antitussives (benzonatate/ Mucinex/Tussionex) --Follow CBC, CMP, D-dimer, and CRP daily --Continue airborne/contact isolation precautions for 10 days from the day of diagnosis (10/13/2020)  The treatment plan and use of medications and known side effects were discussed with patient/family. Some of the medications used are based on case reports/anecdotal data.  All other medications being used in the management of COVID-19 based on limited study data.  Complete risks and long-term side effects are unknown, however in the best clinical judgment they seem to be of some benefit.  Patient wanted to proceed with treatment options provided.  Constipation CT abdomen/pelvis with contrast with no acute intra-abdominal abnormality, gastrotomy tube in place with large colonic stool burden with diffuse mild distention. --miralax per tube daily --Senokot 1 tab p.o. twice daily --SMOG enema x 1 today --Continue monitor output closely  Adult failure to thrive Patient presenting from home with poor oral intake in the preceding 2 days with associated cold symptoms and persistent cough.  Was found to be incidentally positive for COVID-19 as above.  Patient also with history of dementia and chronic PEG tube in place; although patient only utilizes PEG tube occasionally when he does not eat. --SLP recommends dysphagia 3 diet, conical soft with thin liquids; medicine  in pure --Palliative care consulted for goals of care, medical decision making  Hyponatremia Sodium 127 on admission, likely hypovolemic hyponatremia in the setting of poor oral intake. --Na 127>133 --Continue D5 NS  at 75 MLS per hour --Follow BMP daily  Hypokalemia Potassium 3.4 today, will replete. -- Repeat electrolytes in a.m. to include magnesium  Dementia Patient is pleasantly confused. --Keep blinds open and lights on during daylight hours --Minimize the use of opioids/benzodiazepines  Type 2 diabetes mellitus Hemoglobin A1c 7.7.  On metformin 1000 mg p.o. daily at home. --Insulin sliding scale for coverage while inpatient --CBGs QAC/HS  Severe protein calorie malnutrition Patient with significant muscle wasting, thin and cachectic in appearance.  Has had poor appetite over the last 2 days, but suspect this has been going ongoing for some time.  Has a PEG tube in place, only utilizes per family when he does not eat much. --consult nutrition following for further diet/nutrition supplementation recommendations  Weakness/deconditioning/debility: Patient currently at home with son.  Was apparently in rehab 4 years ago. Has walker at home, but does not utilize and usually walks around the house without much assistance at baseline per son's report. --PT/OT evaluation pending; family anticipates return home when medically ready, eval for any home therapy/equipment needs.   DVT prophylaxis: Lovenox Code Status: Full code Family Communication: Updated patient's son, [name] via telephone this morning  Disposition Plan:  Status is: Inpatient  Remains inpatient appropriate because:Persistent severe electrolyte disturbances, Altered mental status, Ongoing diagnostic testing needed not appropriate for outpatient work up, Unsafe d/c plan, IV treatments appropriate due to intensity of illness or inability to take PO and Inpatient level of care appropriate due to severity of illness   Dispo: The patient is from: Home              Anticipated d/c is to: Home              Anticipated d/c date is: 2 days              Patient currently is not medically stable to d/c.   Consultants:   Palliative  care  Procedures:   none  Antimicrobials:   Zosyn 1/9 - 1/10   Subjective: Patient seen and examined at bedside, continues in ED holding area.  Pleasantly confused.  No specific complaints this morning.  No family present at bedside.  Unable to obtain any further ROS given his underlying dementia, although no specific complaints.  No acute concerns overnight per nursing staff.  Objective: Vitals:   10/05/20 0915 10/05/20 1023 10/05/20 1030 10/05/20 1045  BP: 104/62  137/90 123/86  Pulse: 67  74 69  Resp: 10  12 12   Temp:      TempSrc:  Oral    SpO2: 98%  100% 99%  Weight:      Height:        Intake/Output Summary (Last 24 hours) at 10/05/2020 1206 Last data filed at 10/05/2020 1043 Gross per 24 hour  Intake 100 ml  Output 1500 ml  Net -1400 ml   Filed Weights   10/05/20 0750  Weight: 77.1 kg    Examination:  General exam: Appears calm and comfortable, pleasantly confused, thin/cachectic in appearance with significant muscle wasting Respiratory system: Clear to auscultation. Respiratory effort normal.  Oxygenating well on room air Cardiovascular system: S1 & S2 heard, RRR. No JVD, murmurs, rubs, gallops or clicks. No pedal edema. Gastrointestinal system: Abdomen is nondistended, soft and nontender. No organomegaly or masses felt.  Normal bowel sounds heard.  PEG tube noted Central nervous system: Alert, not oriented to person/place/time/situation. No focal neurological deficits. Extremities: Symmetric 5 x 5 power. Skin: No rashes, lesions or ulcers Psychiatry: Judgement and insight appear poor. Mood & affect appropriate.     Data Reviewed: I have personally reviewed following labs and imaging studies  CBC: Recent Labs  Lab 10/03/20 2010 10/04/20 0406 10/05/20 0500  WBC 3.3* 4.2 7.7  NEUTROABS 2.5  --  6.5  HGB 12.1* 12.7* 10.7*  HCT 35.2* 36.6* 32.7*  MCV 85.6 85.9 88.6  PLT 185 179 169   Basic Metabolic Panel: Recent Labs  Lab 10/03/20 2010  10/04/20 0406 10/05/20 0951  NA 127*  --  133*  K 4.4  --  3.4*  CL 91*  --  99  CO2 24  --  26  GLUCOSE 165*  --  163*  BUN 25*  --  11  CREATININE 0.83 0.68 0.61  CALCIUM 9.7  --  9.3   GFR: Estimated Creatinine Clearance: 72.3 mL/min (by C-G formula based on SCr of 0.61 mg/dL). Liver Function Tests: Recent Labs  Lab 10/03/20 2010 10/05/20 0951  AST 28 21  ALT 22 17  ALKPHOS 66 56  BILITOT 0.4 0.3  PROT 5.9* 4.7*  ALBUMIN 2.9* 2.3*   No results for input(s): LIPASE, AMYLASE in the last 168 hours. No results for input(s): AMMONIA in the last 168 hours. Coagulation Profile: Recent Labs  Lab 10/03/20 2010  INR 0.9   Cardiac Enzymes: No results for input(s): CKTOTAL, CKMB, CKMBINDEX, TROPONINI in the last 168 hours. BNP (last 3 results) No results for input(s): PROBNP in the last 8760 hours. HbA1C: Recent Labs    10/04/20 0406  HGBA1C 7.7*   CBG: Recent Labs  Lab 10/04/20 2215 10/05/20 0110 10/05/20 0418 10/05/20 0749 10/05/20 1157  GLUCAP 118* 128* 135* 133* 156*   Lipid Profile: Recent Labs    10/03/20 2250  TRIG 55   Thyroid Function Tests: Recent Labs    10/03/20 2010 10/03/20 2249  TSH  --  0.901  FREET4 1.37*  --    Anemia Panel: Recent Labs    10/03/20 2250  FERRITIN 226   Sepsis Labs: Recent Labs  Lab 10/03/20 2010 10/03/20 2249 10/03/20 2250  PROCALCITON  --   --  <0.10  LATICACIDVEN 4.2* 2.8*  --     Recent Results (from the past 240 hour(s))  Culture, blood (Routine x 2)     Status: None (Preliminary result)   Collection Time: 10/03/20  8:09 PM   Specimen: BLOOD LEFT HAND  Result Value Ref Range Status   Specimen Description BLOOD LEFT HAND  Final   Special Requests   Final    BOTTLES DRAWN AEROBIC ONLY Blood Culture results may not be optimal due to an inadequate volume of blood received in culture bottles   Culture   Final    NO GROWTH 2 DAYS Performed at [facility], [address], [place], [place]  [address]    Report Status PENDING  Incomplete  Resp Panel by RT-PCR (Flu A&B, Covid) Nasopharyngeal Swab     Status: Abnormal   Collection Time: 10/03/20  8:15 PM   Specimen: Nasopharyngeal Swab; Nasopharyngeal(NP) swabs in vial transport medium  Result Value Ref Range Status   SARS Coronavirus 2 by RT PCR POSITIVE (A) NEGATIVE Final    Comment: RESULT CALLED TO, READ BACK BY AND VERIFIED WITH: RN ALEXIS DAVIDSON BY MESSAN H. AT  2155 ON 10/03/2020 (NOTE) SARS-CoV-2 target nucleic acids are DETECTED.  The SARS-CoV-2 RNA is generally detectable in upper respiratory specimens during the acute phase of infection. Positive results are indicative of the presence of the identified virus, but do not rule out bacterial infection or co-infection with other pathogens not detected by the test. Clinical correlation with patient history and other diagnostic information is necessary to determine patient infection status. The expected result is Negative.  Fact Sheet for Patients: BloggerCourse.comhttps://www.fda.gov/media/152166/download  Fact Sheet for Healthcare Providers: SeriousBroker.ithttps://www.fda.gov/media/152162/download  This test is not yet approved or cleared by the [place] FDA and  has been authorized for detection and/or diagnosis of SARS-CoV-2 by FDA under an Emergency Use Authorization (EUA).  This EUA will remain in effect (meanin g this test can be used) for the duration of  the COVID-19 declaration under Section 564(b)(1) of the Act, 21 U.S.C. section 360bbb-3(b)(1), unless the authorization is terminated or revoked sooner.     Influenza A by PCR NEGATIVE NEGATIVE Final   Influenza B by PCR NEGATIVE NEGATIVE Final    Comment: (NOTE) The Xpert Xpress SARS-CoV-2/FLU/RSV plus assay is intended as an aid in the diagnosis of influenza from Nasopharyngeal swab specimens and should not be used as a sole basis for treatment. Nasal washings and aspirates are unacceptable for Xpert Xpress  SARS-CoV-2/FLU/RSV testing.  Fact Sheet for Patients: BloggerCourse.comhttps://www.fda.gov/media/152166/download  Fact Sheet for Healthcare Providers: SeriousBroker.ithttps://www.fda.gov/media/152162/download  This test is not yet approved or cleared by the [place] FDA and has been authorized for detection and/or diagnosis of SARS-CoV-2 by FDA under an Emergency Use Authorization (EUA). This EUA will remain in effect (meaning this test can be used) for the duration of the COVID-19 declaration under Section 564(b)(1) of the Act, 21 U.S.C. section 360bbb-3(b)(1), unless the authorization is terminated or revoked.  Performed at [facility], [address] [place], [place] [address]   Culture, blood (Routine x 2)     Status: None (Preliminary result)   Collection Time: 10/03/20  8:19 PM   Specimen: BLOOD RIGHT FOREARM  Result Value Ref Range Status   Specimen Description BLOOD RIGHT FOREARM  Final   Special Requests   Final    BOTTLES DRAWN AEROBIC AND ANAEROBIC Blood Culture adequate volume   Culture   Final    NO GROWTH 2 DAYS Performed at [facility], [address] [place], [place] [address]    Report Status PENDING  Incomplete         Radiology Studies: CT ABDOMEN PELVIS W CONTRAST  Result Date: 10/04/2020 CLINICAL DATA:  80 year old male with concern for abdominal abscess or infection. EXAM: CT ABDOMEN AND PELVIS WITH CONTRAST TECHNIQUE: Multidetector CT imaging of the abdomen and pelvis was performed using the standard protocol following bolus administration of intravenous contrast. CONTRAST:  75mL OMNIPAQUE IOHEXOL 300 MG/ML  SOLN COMPARISON:  None. FINDINGS: Lower chest: Scattered bibasilar ground-glass opacities and consolidative., Most prominent in the dependent lower lobes. No pleural effusions. The heart is normal in size. No pericardial effusion. Hepatobiliary: The liver is normal in size, contour, and attenuation. No intra or extrahepatic biliary ductal dilation. The  gallbladder is present and unremarkable. Pancreas: Atrophic, not well visualized. Spleen: Normal in size without focal abnormality. Adrenals/Urinary Tract: Adrenal glands are unremarkable. Few scattered simple cortical cyst in the left kidney, largest in the inferior pole measuring up to 5 cm. Kidneys are otherwise normal, without renal calculi, focal lesion, or hydronephrosis. Bladder is unremarkable. Stomach/Bowel: Gastrostomy tube in  place with the retention balloon along the posterior wall of the gastric body. The majority of the small bowel in the left hemiabdomen, decompressed. Large colonic stool burden with diffuse mild distension. The appendix is not definitively visualized. Vascular/Lymphatic: No significant vascular findings are present. No enlarged abdominal or pelvic lymph nodes. Reproductive: Prostatomegaly, measuring up to 6.1 cm in axial dimension, with protrusion into the base of the bladder. Other: No abdominal wall hernia or abnormality. No abdominopelvic ascites. Musculoskeletal: Multilevel degenerative changes of the thoracolumbar spine associated sigmoid curvature. No evidence of acute fracture IMPRESSION: 1. Scattered bibasilar, peripheral predominant independent ground-glass and consolidative opacities as could be seen with multi focal pneumonia. 2. No acute intra-abdominal abnormality. 3. Gastrostomy tube in place with the retention balloon along the posterior wall of the gastric body. If the tube is malfunctioning or leaking along the track, consider retraction of the gastrostomy tube to position the retention balloon along the anterior aspect of the stomach followed by cinching the external bumper down to the level of the skin. Marliss Cootsylan Suttle, MD [facility] Electronically Signed   By: Marliss Cootsylan  Suttle MD   On: 10/04/2020 09:21   DG Chest Portable 1 View  Result Date: 10/03/2020 CLINICAL DATA:  Infection. Increased weakness and cough  for 2 days. Fever. EXAM: PORTABLE CHEST 1 VIEW COMPARISON:  None. FINDINGS: Lung volumes are low. No focal airspace disease. Normal heart size. Mild aortic tortuosity. Otherwise normal mediastinal contours. No pulmonary edema, pleural fluid or pneumothorax. Multiple skin folds project over the chest. Scoliotic curvature of the spine. No acute osseous abnormalities are seen. IMPRESSION: Low lung volumes without acute abnormality. Electronically Signed   By: Narda RutherfordMelanie  Sanford M.D.   On: 10/03/2020 21:09        Scheduled Meds: . bisacodyl  10 mg Rectal Once  . enoxaparin (LOVENOX) injection  40 mg Subcutaneous Q24H  . insulin aspart  0-6 Units Subcutaneous TID WC  . polyethylene glycol  17 g Per Tube Daily   Continuous Infusions: . dextrose 5 % and 0.9% NaCl 75 mL/hr at 10/04/20 0945     LOS: 2 days    Time spent: 38 minutes spent on chart review, discussion with nursing staff, consultants, updating family and interview/physical exam; more than 50% of that time was spent in counseling and/or coordination of care.    Alvira PhilipsEric J [place], DO [facility] Available via Epic secure chat 7am-7pm After these hours, please refer to coverage provider listed on [email or web address] 10/05/2020, 12:06 PM

## 2020-10-05 NOTE — Evaluation (Signed)
Physical Therapy Evaluation Patient Details Name: Justin Hawkins MRN: [id number] DOB: [birth year] Today's Date: 10/05/2020   History of Present Illness  Pt is 80 yo male with PMH including dementia, DM2, HTN, and previous CVA who was brought to the emergency department after patient has not been eating well over the last 2 days with cold symptoms with persistent cough.  Patient with a PEG tube, family states does not usually use the PEG for nutrition, only when he does not eat anything. Pt found to be COVID 19 + and admitted with SIRS.  Clinical Impression   Pt admitted with above diagnosis. Pt requiring min -mod A for transfers and with limited ambulation in the room.  He has dementia and was unable to provide any history.  History obtained from son and daughter in law.  Pt required min guard to min A at home per family and could ambulate in house.  Family did report that pt required increased assist when he initially gets up but is able to progress.  Pt currently with functional limitations due to the deficits listed below (see PT Problem List). Pt will benefit from skilled PT to increase their independence and safety with mobility to allow discharge to the venue listed below.  Considering pt with dementia and may do better in familiar environment, has DME, has family support, and there are limited SNF beds for COVID pt; recommending home with 24 hr support at this time.      Follow Up Recommendations Home health PT;Supervision/Assistance - 24 hour    Equipment Recommendations  None recommended by PT;Other (comment) (has DME)    Recommendations for Other Services       Precautions / Restrictions Precautions Precautions: Fall      Mobility  Bed Mobility Overal bed mobility: Needs Assistance Bed Mobility: Supine to Sit;Sit to Supine     Supine to sit: Min assist Sit to supine: Mod assist   General bed mobility comments: Increased time and multimodal cues; min A to lift trunk and  scoot forward; mod A to return to bed -seemed more related to unable to follow commands    Transfers Overall transfer level: Needs assistance Equipment used: Rolling walker (2 wheeled) Transfers: Sit to/from Stand Sit to Stand: Min assist         General transfer comment: Min A to rise and assist for safe hand placement.  Ambulation/Gait Ambulation/Gait assistance: Min assist Gait Distance (Feet): 5 Feet Assistive device: Rolling walker (2 wheeled) Gait Pattern/deviations: Step-to pattern;Decreased stride length;Shuffle Gait velocity: decreased   General Gait Details: Ambulated 5' in room with RW and min A.  Limited ambulation due to in ED room on COVID precautions and pt having difficulty navigating/following commands in tight space. Did not speak to family until after evaluation to determine pt does not typically use RW and is slow to start ambulation.  Stairs            Wheelchair Mobility    Modified Rankin (Stroke Patients Only)       Balance Overall balance assessment: Needs assistance Sitting-balance support: No upper extremity supported Sitting balance-Leahy Scale: Fair Sitting balance - Comments: EOB without support   Standing balance support: No upper extremity supported Standing balance-Leahy Scale: Poor Standing balance comment: Required RW ad min A to steady                             Pertinent Vitals/Pain Pain Assessment: No/denies pain  Home Living Family/patient expects to be discharged to:: Private residence Living Arrangements:  (lives with son (recently had BKA) and daughter in law (provides assistance pt needs)) Available Help at Discharge:  (daughter in law reports she works but Gaffer on taking FMLA) Type of Home: House Home Access: Stairs to enter Entrance Stairs-Rails: Doctor, general practice of Steps: 3 Home Layout: Two level;Able to live on main level with bedroom/bathroom Home Equipment: Shower  seat;Hospital bed;Walker - 2 wheels;Toilet riser;Other (comment) (lift chair) Additional Comments: Spoke with son and daughter in law on phone who provided home environment and PLOF    Prior Function Level of Independence: Needs assistance   Gait / Transfers Assistance Needed: Walks around in house without AD (refuses RW) with min guard/assist; daughter in law assist; reports he is stiff and needs assist initially but progresses and able to move around in house and perform stairs.  ADL's / Homemaking Assistance Needed: Has assist with ADLs, reports he will do toielting ADLs (daughter in law assist); daughter in law reports pt states "I've retired, I took care of my kids now it's time for them to take care of me."        Hand Dominance        Extremity/Trunk Assessment   Upper Extremity Assessment Upper Extremity Assessment: Difficult to assess due to impaired cognition;Generalized weakness (Demonstrated ROM WFL and at least 3/5 but unable to follow further commands)    Lower Extremity Assessment Lower Extremity Assessment: Generalized weakness;Difficult to assess due to impaired cognition (Demonstrated ROM WFL and at least 3/5 but unable to follow further commands)    Cervical / Trunk Assessment Cervical / Trunk Assessment: Normal  Communication      Cognition Arousal/Alertness: Awake/alert Behavior During Therapy: Flat affect Overall Cognitive Status: No family/caregiver present to determine baseline cognitive functioning                                 General Comments: Pt with hx of dementia.  He stated year 1972 and summertime (it is October 05, 2020), thinks he is at home.  He did follow commands with increased time and multimodal cues.      General Comments General comments (skin integrity, edema, etc.): Pt was on RA with VSS (O2 sats >98%)   Spoke with son and daughter in law on phone.  Discussed pt requiring min-mod A today and with limited ambulation in  room. Discussed PT role and POC in making recommendations at discharge.  Discussed that pt is borderline for home with therapy vs SNF based on today.  Discussed pt not as mobile as he is at baseline but some limitations due to tight ED room, PT didn't know baseline at time of eval, and pt with dementia in unfamiliar environment.  Also, made family aware there are very few to no SNF beds available for pt's with COVID.  They verbalized understanding and felt that as long as pt could maintain mobility they could manage him at home.  They do have a w/c if needed.     Exercises     Assessment/Plan    PT Assessment Patient needs continued PT services  PT Problem List Decreased strength;Decreased mobility;Decreased safety awareness;Decreased knowledge of precautions;Decreased activity tolerance;Decreased cognition;Cardiopulmonary status limiting activity;Decreased balance;Decreased knowledge of use of DME       PT Treatment Interventions DME instruction;Therapeutic activities;Cognitive remediation;Gait training;Therapeutic exercise;Patient/family education;Stair training;Balance training;Functional mobility training    PT Goals (  Current goals can be found in the Care Plan section)  Acute Rehab PT Goals Patient Stated Goal: pt unable; family reports as long as pt maintains mobility with min A could take him home PT Goal Formulation: With patient/family Time For Goal Achievement: 10/19/20 Potential to Achieve Goals: Good    Frequency Min 3X/week   Barriers to discharge        Co-evaluation               AM-PAC PT "6 Clicks" Mobility  Outcome Measure Help needed turning from your back to your side while in a flat bed without using bedrails?: A Little Help needed moving from lying on your back to sitting on the side of a flat bed without using bedrails?: A Little Help needed moving to and from a bed to a chair (including a wheelchair)?: A Little Help needed standing up from a chair using  your arms (e.g., wheelchair or bedside chair)?: A Little Help needed to walk in hospital room?: A Little Help needed climbing 3-5 steps with a railing? : A Lot 6 Click Score: 17    End of Session Equipment Utilized During Treatment: Gait belt (gait belt under arms (PEG)) Activity Tolerance: Patient tolerated treatment well Patient left: in bed;with call bell/phone within reach;Other (comment) (ED on stretcher) Nurse Communication: Mobility status PT Visit Diagnosis: Unsteadiness on feet (R26.81);Muscle weakness (generalized) (M62.81)    Time: 1137-1200 PT Time Calculation (min) (ACUTE ONLY): 23 min   Charges:   PT Evaluation $PT Eval Low Complexity: 1 Low PT Treatments $Therapeutic Activity: 8-22 mins        Anise Salvo, PT Acute Rehab Services Pager [phone] [facility] [phone]    Rayetta Humphrey 10/05/2020, 1:55 PM

## 2020-10-05 NOTE — ED Notes (Signed)
Lunch Tray Ordered @ 1107. 

## 2020-10-05 NOTE — ED Notes (Signed)
Pt had another very large BM. Pt cleaned and changed, applied brief.

## 2020-10-06 DIAGNOSIS — R627 Adult failure to thrive: Secondary | ICD-10-CM

## 2020-10-06 DIAGNOSIS — F0390 Unspecified dementia without behavioral disturbance: Secondary | ICD-10-CM | POA: Diagnosis not present

## 2020-10-06 DIAGNOSIS — U071 COVID-19: Secondary | ICD-10-CM | POA: Diagnosis not present

## 2020-10-06 DIAGNOSIS — R6510 Systemic inflammatory response syndrome (SIRS) of non-infectious origin without acute organ dysfunction: Secondary | ICD-10-CM | POA: Diagnosis not present

## 2020-10-06 LAB — COMPREHENSIVE METABOLIC PANEL
ALT: 19 U/L (ref 0–44)
AST: 20 U/L (ref 15–41)
Albumin: 2.2 g/dL — ABNORMAL LOW (ref 3.5–5.0)
Alkaline Phosphatase: 55 U/L (ref 38–126)
Anion gap: 7 (ref 5–15)
BUN: 11 mg/dL (ref 8–23)
CO2: 25 mmol/L (ref 22–32)
Calcium: 9.6 mg/dL (ref 8.9–10.3)
Chloride: 103 mmol/L (ref 98–111)
Creatinine, Ser: 0.54 mg/dL — ABNORMAL LOW (ref 0.61–1.24)
GFR, Estimated: >60 mL/min (ref >60)
Glucose, Bld: 187 mg/dL — ABNORMAL HIGH (ref 70–99)
Potassium: 3.6 mmol/L (ref 3.5–5.1)
Sodium: 135 mmol/L (ref 135–145)
Total Bilirubin: 0.3 mg/dL (ref 0.3–1.2)
Total Protein: 5 g/dL — ABNORMAL LOW (ref 6.5–8.1)

## 2020-10-06 LAB — GLUCOSE, CAPILLARY
Glucose-Capillary: 150 mg/dL — ABNORMAL HIGH (ref 70–99)
Glucose-Capillary: 167 mg/dL — ABNORMAL HIGH (ref 70–99)
Glucose-Capillary: 172 mg/dL — ABNORMAL HIGH (ref 70–99)
Glucose-Capillary: 209 mg/dL — ABNORMAL HIGH (ref 70–99)
Glucose-Capillary: 232 mg/dL — ABNORMAL HIGH (ref 70–99)

## 2020-10-06 LAB — CBC WITH DIFFERENTIAL/PLATELET
Abs Immature Granulocytes: 0.02 10*3/uL (ref 0.00–0.07)
Basophils Absolute: 0 10*3/uL (ref 0.0–0.1)
Basophils Relative: 0 %
Eosinophils Absolute: 0 10*3/uL (ref 0.0–0.5)
Eosinophils Relative: 0 %
HCT: 30.2 % — ABNORMAL LOW (ref 39.0–52.0)
Hemoglobin: 9.6 g/dL — ABNORMAL LOW (ref 13.0–17.0)
Immature Granulocytes: 0 %
Lymphocytes Relative: 12 %
Lymphs Abs: 0.5 10*3/uL — ABNORMAL LOW (ref 0.7–4.0)
MCH: 29.8 pg (ref 26.0–34.0)
MCHC: 31.8 g/dL (ref 30.0–36.0)
MCV: 93.8 fL (ref 80.0–100.0)
Monocytes Absolute: 0.3 10*3/uL (ref 0.1–1.0)
Monocytes Relative: 7 %
Neutro Abs: 3.7 10*3/uL (ref 1.7–7.7)
Neutrophils Relative %: 81 %
Platelets: 147 10*3/uL — ABNORMAL LOW (ref 150–400)
RBC: 3.22 MIL/uL — ABNORMAL LOW (ref 4.22–5.81)
RDW: 12.8 % (ref 11.5–15.5)
WBC: 4.6 10*3/uL (ref 4.0–10.5)
nRBC: 0 % (ref 0.0–0.2)

## 2020-10-06 LAB — D-DIMER, QUANTITATIVE: D-Dimer, Quant: 0.61 ug/mL-FEU — ABNORMAL HIGH (ref 0.00–0.50)

## 2020-10-06 LAB — PHOSPHORUS: Phosphorus: 2 mg/dL — ABNORMAL LOW (ref 2.5–4.6)

## 2020-10-06 LAB — MAGNESIUM: Magnesium: 1.7 mg/dL (ref 1.7–2.4)

## 2020-10-06 LAB — C-REACTIVE PROTEIN: CRP: 8.5 mg/dL — ABNORMAL HIGH (ref <1.0)

## 2020-10-06 LAB — CBG MONITORING, ED: Glucose-Capillary: 166 mg/dL — ABNORMAL HIGH (ref 70–99)

## 2020-10-06 MED ORDER — ENSURE ENLIVE PO LIQD
237.0000 mL | Freq: Two times a day (BID) | ORAL | Status: DC
  Administered 2020-10-06 – 2020-10-07 (×3): 237 mL via ORAL

## 2020-10-06 MED ORDER — POLYETHYLENE GLYCOL 3350 17 G PO PACK
17.0000 g | PACK | Freq: Every day | ORAL | Status: DC
  Administered 2020-10-06 – 2020-10-07 (×2): 17 g
  Filled 2020-10-06 (×2): qty 1

## 2020-10-06 MED ORDER — OSMOLITE 1.2 CAL PO LIQD: 1000.0000 mL | ORAL | Status: DC

## 2020-10-06 MED ORDER — PROSOURCE TF PO LIQD
45.0000 mL | Freq: Two times a day (BID) | ORAL | Status: DC
  Administered 2020-10-06 – 2020-10-07 (×2): 45 mL
  Filled 2020-10-06 (×4): qty 45

## 2020-10-06 MED ORDER — POTASSIUM CHLORIDE 20 MEQ PO PACK
40.0000 meq | PACK | Freq: Once | ORAL | Status: AC
  Administered 2020-10-06: 40 meq via ORAL
  Filled 2020-10-06: qty 2

## 2020-10-06 MED ORDER — OSMOLITE 1.5 CAL PO LIQD
237.0000 mL | Freq: Four times a day (QID) | ORAL | Status: DC
  Administered 2020-10-06 – 2020-10-07 (×4): 237 mL
  Filled 2020-10-06 (×6): qty 237

## 2020-10-06 NOTE — Progress Notes (Signed)
Initial Nutrition Assessment  INTERVENTION:  Provide Ensure Enlive po BID, each supplement provides 350 kcal and 20 grams of protein.  Encourage PO intake.   Initiate bolus feeds using Osmolite 1.5 formula via PEG at volume of 237 ml (1 ARC) given QID.   Provide 45 ml Prosource TF BID per tube.    Tube feeding regimen provides 1502 kcal (81% of kcal needs), 81 grams of protein (90% of protein needs), and 720 ml of H2O.   NUTRITION DIAGNOSIS:   Inadequate oral intake related to poor appetite as evidenced by meal completion < 25%.  GOAL:   Patient will meet greater than or equal to 90% of their needs  MONITOR:   PO intake,Supplement acceptance,Skin,TF tolerance,Weight trends,Labs,I & O's  REASON FOR ASSESSMENT:   Consult Assessment of nutrition requirement/status  ASSESSMENT:   80 year-old male with past medical history significant for dementia, type 2 diabetes mellitus, essential hypertension, previous CVA presents with cold symptoms with persistent cough. Pt found to be COVID positive.  RD contact pt's Son via phone. He and his wife report they usually bolus 4 bottles of Ensure a day through his PEG to aid in providing caloric and protein needs as pt with poor po. Family reports pt has had PEG tube in place over the past 4 years. They additionally report providing free water flushes via tube as pt with poor fluid intake by mouth. Pt continues with poor po since admission and only able to consume 1 bite of food at his breakfast meal tray. RD given consent via MD and family to initiate enteral nutrition via PEG tube. RD to additionally order Ensure PO to encourage intake. Unable to complete Nutrition-Focused physical exam at this time.   Labs and medications reviewed.   Diet Order:   Diet Order            DIET DYS 3 Room service appropriate? Yes; Fluid consistency: Thin  Diet effective now                 EDUCATION NEEDS:   Not appropriate for education at this  time  Skin:  Skin Assessment: Reviewed RN Assessment  Last BM:  1/11  Height:   Ht Readings from Last 1 Encounters:  10/05/20 5\' 7"  (1.702 m)    Weight:   Wt Readings from Last 1 Encounters:  10/05/20 77.1 kg   BMI:  Body mass index is 26.63 kg/m.  Estimated Nutritional Needs:   Kcal:  1850-2000  Protein:  90-100 grams  Fluid:  >/= 1.8 L/day  12/03/20, MS, RD, LDN RD pager number/after hours weekend pager number on Amion.

## 2020-10-06 NOTE — Progress Notes (Signed)
[facility] Community [facility]iative [facility]       This patient has been referred to our palliative care services in the community.  [facility] will continue to follow for any discharge planning needs and to coordinate admission onto palliative care.    If you have questions or need assistance, please call [phone] or contact the hospital Liaison listed on AMION.     Thank you for the opportunity to participate in this patient's care.     Chrislyn King, BSN, RN [facility] Hospital Liaison [phone] (24h on call)   

## 2020-10-06 NOTE — Progress Notes (Signed)
PROGRESS NOTE    Justin Hawkins  WEX:[id number] DOB: 1940/10/16 DOA: 10/03/2020 PCP: [facility]    Brief Narrative:  Justin Hawkins is a 80 year-old male with past medical history significant for dementia, type 2 diabetes mellitus, essential hypertension, previous CVA who was brought to the emergency department after patient has not been eating well over the last 2 days with cold symptoms with persistent cough.  Patient with a PEG tube, family states does not usually use the PEG for nutrition, only when he does not eat anything.  No reported nausea, vomiting, diarrhea, chest pain or shortness of breath.  Patient apparently not "acting like himself" which is a change and thus he was brought to the ED for further evaluation.  In the ED, temperature 98.4, HR 104, RR 24, BP 141/79, SPO2 98% on room air.  Sodium 127, potassium 4.4, chloride 91, CO2 24, glucose 165, BUN 25, creatinine 0.83, AST 28, ALT 22, total bilirubin 0.4.  WBC 3.3, hemoglobin 12.1, platelet 185.  Troponin 45>10.  Lactic acid 4.2>2.8.  INR 0.9.  TSH 0.901, free T4 1.37.  Covid-19 PCR positive.  Influenza A/B PCR negative.  Chest x-ray with low lung volumes, otherwise no acute cardiopulmonary disease process.  Patient was started on Zosyn by ED physician for concern of underlying infectious etiology.  Hospitalist service consulted for further evaluation and treatment adult failure to thrive, and COVID-19 viral infection.   Assessment & Plan:   Principal Problem:   SIRS (systemic inflammatory response syndrome) (HCC) Active Problems:   COVID-19 virus infection   Normocytic normochromic anemia   SIRS, POA Acute Covid-19 viral infection during the ongoing Covid 19 Pandemic - POA Patient presenting from home with poor oral intake, failure to thrive.  Was found to be incidentally positive for COVID-19 PCR.  Son reports that patient is vaccinated, has received 2 doses of the Pfizer vaccine, but has not received a booster  as son reports it is not that time as of yet.  WBC and procalcitonin within normal limits, discontinued Zosyn.  Completed 3-day course of remdesivir on 10/05/2020. --COVID test: + PCR 1/9 --CRP 2.2>9.3>8.5 --ddimer 0.43>0.43>0.61 --Currently oxygenating well on room air --Continue supportive care with albuterol MDI prn, vitamin C, zinc, Tylenol, antitussives (benzonatate/ Mucinex/Tussionex) --Follow CBC, CMP, D-dimer, and CRP daily --Continue airborne/contact isolation precautions for 10 days from the day of diagnosis (10/13/2020)  The treatment plan and use of medications and known side effects were discussed with patient/family. Some of the medications used are based on case reports/anecdotal data.  All other medications being used in the management of COVID-19 based on limited study data.  Complete risks and long-term side effects are unknown, however in the best clinical judgment they seem to be of some benefit.  Patient wanted to proceed with treatment options provided.  Constipation: resolved CT abdomen/pelvis with contrast with no acute intra-abdominal abnormality, gastrotomy tube in place with large colonic stool burden with diffuse mild distention. Received SMOG enema on 1/11 with several large bowel movements. --miralax per tube daily --Senokot 1 tab p.o. twice daily --Continue monitor output closely  Adult failure to thrive Patient presenting from home with poor oral intake in the preceding 2 days with associated cold symptoms and persistent cough.  Was found to be incidentally positive for COVID-19 as above.  Patient also with history of dementia and chronic PEG tube in place; although patient only utilizes PEG tube occasionally when he does not eat. --SLP recommends dysphagia 3 diet, conical  soft with thin liquids; medicine in pure --Palliative following  Hyponatremia Sodium 127 on admission, likely hypovolemic hyponatremia in the setting of poor oral intake. --Na  127>133>135 --Continue D5 NS at 75 mL/hr --Follow BMP daily  Hypokalemia Potassium 3.6 today, will replete. -- Repeat electrolytes in a.m. to include magnesium  Dementia Patient is pleasantly confused. --Keep blinds open and lights on during daylight hours --Minimize the use of opioids/benzodiazepines  Type 2 diabetes mellitus Hemoglobin A1c 7.7.  On metformin 1000 mg p.o. daily at home. --Insulin sliding scale for coverage while inpatient --CBGs QAC/HS  Severe protein calorie malnutrition Patient with significant muscle wasting, thin and cachectic in appearance.  Has had poor appetite over the last 2 days, but suspect this has been going ongoing for some time.  Has a PEG tube in place, only utilizes per family when he does not eat much. --consult nutrition following for further diet/nutrition supplementation recommendations  Weakness/deconditioning/debility: Patient currently at home with son.  Was apparently in rehab 4 years ago. Has walker at home, but does not utilize and usually walks around the house without much assistance at baseline per son's report. --PT recommends home health   DVT prophylaxis: Lovenox Code Status: Full code Family Communication: Updated patient's son, Justin Hawkins via telephone this afternoon  Disposition Plan:  Status is: Inpatient  Remains inpatient appropriate because:Persistent severe electrolyte disturbances, Altered mental status, Ongoing diagnostic testing needed not appropriate for outpatient work up, Unsafe d/c plan, IV treatments appropriate due to intensity of illness or inability to take PO and Inpatient level of care appropriate due to severity of illness   Dispo: The patient is from: Home              Anticipated d/c is to: Home              Anticipated d/c date is: 1 day              Patient currently is not medically stable to d/c. Plan discharge home 1/13 with home health and outpatient palliative.   Consultants:   Palliative  care  Procedures:   none  Antimicrobials:   Zosyn 1/9 - 1/10   Subjective: Patient seen and examined at bedside, lying in bed, eating breakfast.  No complaints this morning.  Pleasantly confused.  No acute concerns per nursing staff overnight.  Objective: Vitals:   10/06/20 0600 10/06/20 0700 10/06/20 1100 10/06/20 1201  BP:  134/74  133/75  Pulse:  71  79  Resp:  18  20  Temp:  97.6 F (36.4 C)  (!) 97.4 F (36.3 C)  TempSrc:  Oral  Oral  SpO2: 100% 100% 100% 100%  Weight:      Height:        Intake/Output Summary (Last 24 hours) at 10/06/2020 1301 Last data filed at 10/06/2020 1000 Gross per 24 hour  Intake 3510.03 ml  Output --  Net 3510.03 ml   Filed Weights   10/05/20 0750  Weight: 77.1 kg    Examination:  General exam: Appears calm and comfortable, pleasantly confused, thin/cachectic in appearance with significant muscle wasting Respiratory system: Clear to auscultation. Respiratory effort normal.  Oxygenating well on room air Cardiovascular system: S1 & S2 heard, RRR. No JVD, murmurs, rubs, gallops or clicks. No pedal edema. Gastrointestinal system: Abdomen is nondistended, soft and nontender. No organomegaly or masses felt. Normal bowel sounds heard.  PEG tube noted Central nervous system: Alert, not oriented to person/place/time/situation. No focal neurological deficits. Extremities: Symmetric 5  x 5 power. Skin: No rashes, lesions or ulcers Psychiatry: Judgement and insight appear poor. Mood & affect appropriate.     Data Reviewed: I have personally reviewed following labs and imaging studies  CBC: Recent Labs  Lab 10/03/20 2010 10/04/20 0406 10/05/20 0500 10/06/20 0415  WBC 3.3* 4.2 7.7 4.6  NEUTROABS 2.5  --  6.5 3.7  HGB 12.1* 12.7* 10.7* 9.6*  HCT 35.2* 36.6* 32.7* 30.2*  MCV 85.6 85.9 88.6 93.8  PLT 185 179 169 147*   Basic Metabolic Panel: Recent Labs  Lab 10/03/20 2010 10/04/20 0406 10/05/20 0951 10/06/20 0415  NA 127*  --   133* 135  K 4.4  --  3.4* 3.6  CL 91*  --  99 103  CO2 24  --  26 25  GLUCOSE 165*  --  163* 187*  BUN 25*  --  11 11  CREATININE 0.83 0.68 0.61 0.54*  CALCIUM 9.7  --  9.3 9.6  MG  --   --   --  1.7  PHOS  --   --   --  2.0*   GFR: Estimated Creatinine Clearance: 72.3 mL/min (A) (by C-G formula based on SCr of 0.54 mg/dL (L)). Liver Function Tests: Recent Labs  Lab 10/03/20 2010 10/05/20 0951 10/06/20 0415  AST 28 21 20   ALT 22 17 19   ALKPHOS 66 56 55  BILITOT 0.4 0.3 0.3  PROT 5.9* 4.7* 5.0*  ALBUMIN 2.9* 2.3* 2.2*   No results for input(s): LIPASE, AMYLASE in the last 168 hours. No results for input(s): AMMONIA in the last 168 hours. Coagulation Profile: Recent Labs  Lab 10/03/20 2010  INR 0.9   Cardiac Enzymes: No results for input(s): CKTOTAL, CKMB, CKMBINDEX, TROPONINI in the last 168 hours. BNP (last 3 results) No results for input(s): PROBNP in the last 8760 hours. HbA1C: Recent Labs    10/04/20 0406  HGBA1C 7.7*   CBG: Recent Labs  Lab 10/05/20 1958 10/05/20 2232 10/06/20 0456 10/06/20 0733 10/06/20 1203  GLUCAP 160* 154* 166* 150* 167*   Lipid Profile: Recent Labs    10/03/20 2250  TRIG 55   Thyroid Function Tests: Recent Labs    10/03/20 2010 10/03/20 2249  TSH  --  0.901  FREET4 1.37*  --    Anemia Panel: Recent Labs    10/03/20 2250  FERRITIN 226   Sepsis Labs: Recent Labs  Lab 10/03/20 2010 10/03/20 2249 10/03/20 2250  PROCALCITON  --   --  <0.10  LATICACIDVEN 4.2* 2.8*  --     Recent Results (from the past 240 hour(s))  Culture, blood (Routine x 2)     Status: None (Preliminary result)   Collection Time: 10/03/20  8:09 PM   Specimen: BLOOD LEFT HAND  Result Value Ref Range Status   Specimen Description BLOOD LEFT HAND  Final   Special Requests   Final    BOTTLES DRAWN AEROBIC ONLY Blood Culture results may not be optimal due to an inadequate volume of blood received in culture bottles   Culture   Final    NO  GROWTH 3 DAYS Performed at [facility], [address] [place], [address] [place]    Report Status PENDING  Incomplete  Resp Panel by RT-PCR (Flu A&B, Covid) Nasopharyngeal Swab     Status: Abnormal   Collection Time: 10/03/20  8:15 PM   Specimen: Nasopharyngeal Swab; Nasopharyngeal(NP) swabs in vial transport medium  Result Value Ref Range Status   SARS  Coronavirus 2 by RT PCR POSITIVE (A) NEGATIVE Final    Comment: RESULT CALLED TO, READ BACK BY AND VERIFIED WITH: RN ALEXIS DAVIDSON BY MESSAN H. AT 2155 ON 10/03/2020 (NOTE) SARS-CoV-2 target nucleic acids are DETECTED.  The SARS-CoV-2 RNA is generally detectable in upper respiratory specimens during the acute phase of infection. Positive results are indicative of the presence of the identified virus, but do not rule out bacterial infection or co-infection with other pathogens not detected by the test. Clinical correlation with patient history and other diagnostic information is necessary to determine patient infection status. The expected result is Negative.  Fact Sheet for Patients: BloggerCourse.comhttps://www.fda.gov/media/152166/download  Fact Sheet for Healthcare Providers: SeriousBroker.ithttps://www.fda.gov/media/152162/download  This test is not yet approved or cleared by the [place] FDA and  has been authorized for detection and/or diagnosis of SARS-CoV-2 by FDA under an Emergency Use Authorization (EUA).  This EUA will remain in effect (meanin g this test can be used) for the duration of  the COVID-19 declaration under Section 564(b)(1) of the Act, 21 U.S.C. section 360bbb-3(b)(1), unless the authorization is terminated or revoked sooner.     Influenza A by PCR NEGATIVE NEGATIVE Final   Influenza B by PCR NEGATIVE NEGATIVE Final    Comment: (NOTE) The Xpert Xpress SARS-CoV-2/FLU/RSV plus assay is intended as an aid in the diagnosis of influenza from Nasopharyngeal swab specimens and should not be used as a sole basis for treatment.  Nasal washings and aspirates are unacceptable for Xpert Xpress SARS-CoV-2/FLU/RSV testing.  Fact Sheet for Patients: BloggerCourse.comhttps://www.fda.gov/media/152166/download  Fact Sheet for Healthcare Providers: SeriousBroker.ithttps://www.fda.gov/media/152162/download  This test is not yet approved or cleared by the [place] FDA and has been authorized for detection and/or diagnosis of SARS-CoV-2 by FDA under an Emergency Use Authorization (EUA). This EUA will remain in effect (meaning this test can be used) for the duration of the COVID-19 declaration under Section 564(b)(1) of the Act, 21 U.S.C. section 360bbb-3(b)(1), unless the authorization is terminated or revoked.  Performed at [facility], [address] [place], [place] [address]   Culture, blood (Routine x 2)     Status: None (Preliminary result)   Collection Time: 10/03/20  8:19 PM   Specimen: BLOOD RIGHT FOREARM  Result Value Ref Range Status   Specimen Description BLOOD RIGHT FOREARM  Final   Special Requests   Final    BOTTLES DRAWN AEROBIC AND ANAEROBIC Blood Culture adequate volume   Culture   Final    NO GROWTH 3 DAYS Performed at [facility], [address] [place], [place] [address]    Report Status PENDING  Incomplete         Radiology Studies: No results found.      Scheduled Meds: . bisacodyl  10 mg Rectal Once  . enoxaparin (LOVENOX) injection  40 mg Subcutaneous Q24H  . insulin aspart  0-6 Units Subcutaneous TID WC  . polyethylene glycol  17 g Per Tube Daily  . potassium chloride  40 mEq Oral Once  . senna-docusate  1 tablet Oral BID   Continuous Infusions: . dextrose 5 % and 0.9% NaCl 75 mL/hr at 10/06/20 1000     LOS: 3 days    Time spent: 37 minutes spent on chart review, discussion with nursing staff, consultants, updating family and interview/physical exam; more than 50% of that time was spent in counseling and/or coordination of care.    Alvira PhilipsEric J [place], DO [facility] Available via Epic secure chat 7am-7pm After  these hours, please refer to coverage provider listed on [email or web address] 10/06/2020, 1:01 PM

## 2020-10-06 NOTE — ED Notes (Signed)
SDU  Breakfast ordered  

## 2020-10-06 NOTE — Progress Notes (Signed)
Brief Palliative Medicine Progress Note:  Chart review performed. Received report from primary RN - no acute concerns. RN reports patient worked well with OT (got up to chair) but his oral intake remains poor. Noted in chart patient has been referred for outpatient Palliative Care services with [facility].   Recommendations:  Agree with outpatient PC referral  Family would benefit from nutrition education - as per previous conversations, their goal is to continue to utilize PEG tube to supplement patient's nutritional needs he doesn't get orally  Thank you for allowing PMT to assist in the care for this patient.  Lavoris Canizales M. Katrinka Blazing, FNP-BC Palliative Medicine Team Team Phone: [phone]  Total time: 15 minutes  Greater than 50%  of this time was spent counseling and coordinating care related to the above assessment and plan.

## 2020-10-06 NOTE — Evaluation (Signed)
Clinical/Bedside Swallow Evaluation Patient Details  Name: Justin Hawkins MRN: [id number] Date of Birth: 1941-08-27  Today's Date: 10/06/2020 Time: SLP Start Time (ACUTE ONLY): 1533 SLP Stop Time (ACUTE ONLY): 1548 SLP Time Calculation (min) (ACUTE ONLY): 15 min  Past Medical History:  Past Medical History:  Diagnosis Date  . Dementia (HCC)   . Diabetes mellitus without complication (HCC)   . Stroke [facility])    Past Surgical History:  Past Surgical History:  Procedure Laterality Date  . IR REPLC GASTRO/COLONIC TUBE PERCUT W/FLUORO  10/08/2018   HPI:  80 y.o. male with history of dementia, diabetes mellitus, hypertension previous stroke was brought to the ER after patient has not been eating well for the last 2 days has been having cold symptoms with persistent cough.  Patient has a PEG tube but as per the patient's daughter-in-law he does eat the use PEG tube only when he does not eat anything.  Did not have any nausea vomiting diarrhea chest pain or shortness of breath.  Did have some incontinence of urine.  And was not acting himself, found to have COVID-19. Swallow evaluated on 1/11 and found to be functional; dysphagia 3/thins recommended.  New orders received 1/12 due to observed oral holding of POs for up to five minutes, wet coughing associated with oral intake.   Assessment / Plan / Recommendation Clinical Impression  New orders for swallow eval today after pt demonstrated increased oral holding, coughing during meal with OT. Pt was sitting in recliner; stated his name, that he was retired from work in Production designer, theatre/television/film and from [place], [place]. He was agreeable to eating applesauce and drinking thin water.  He demonstrated prolonged oral preparation, eventual swallow, then repetitive chewing/sucking behaviors after the bolus was cleared from his mouth.  He sipped water from a cup and straw with similar delay and oral-motor movement after completion. There was occasional coughing noted during  session.  Oral suctioning was set-up and pt self-suctioned after he finished the applesauce.  Recommend downgrading diet to dysphagia 1, thin liquids while admitted.  Give meds either whole or crushed in applesauce.  If coughing persists or worsens with thin liquids, hold liquids.  SLP will continue to follow while admitted. SLP Visit Diagnosis: Dysphagia, oral phase (R13.11)    Aspiration Risk  Mild aspiration risk    Diet Recommendation   dysphagia 1, thin liquids  Medication Administration: Whole meds with liquid (or crushed)    Other  Recommendations Oral Care Recommendations: Oral care BID   Follow up Recommendations 24 hour supervision/assistance      Frequency and Duration min 2x/week  2 weeks       Prognosis Prognosis for Safe Diet Advancement: Fair Barriers to Reach Goals: Cognitive deficits      Swallow Study   General Date of Onset: 10/04/20 HPI: 80 y.o. male with history of dementia, diabetes mellitus, hypertension previous stroke was brought to the ER after patient has not been eating well for the last 2 days has been having cold symptoms with persistent cough.  Patient has a PEG tube but as per the patient's daughter-in-law he does eat the use PEG tube only when he does not eat anything.  Did not have any nausea vomiting diarrhea chest pain or shortness of breath.  Did have some incontinence of urine.  And was not acting himself, found to have COVID-19. Swallow evaluated on 1/11 and found to be functional; dysphagia 3/thins recommended.  New orders received 1/12 due to observed oral holding of  POs for up to five minutes, wet coughing associated with oral intake. Type of Study: Bedside Swallow Evaluation Previous Swallow Assessment: 10/05/20 Diet Prior to this Study: Dysphagia 3 (soft);Thin liquids Temperature Spikes Noted: No Respiratory Status: Room air History of Recent Intubation: No Behavior/Cognition: Alert;Confused;Requires cueing Oral Cavity Assessment: Within  Functional Limits Oral Care Completed by SLP: No Oral Cavity - Dentition: Missing dentition;Poor condition Self-Feeding Abilities: Needs assist Patient Positioning: Upright in chair Baseline Vocal Quality: Low vocal intensity Volitional Cough: Cognitively unable to elicit Volitional Swallow: Able to elicit    Oral/Motor/Sensory Function Overall Oral Motor/Sensory Function: Generalized oral weakness   Ice Chips Ice chips: Not tested   Thin Liquid Thin Liquid: Impaired Presentation: Cup;Straw Oral Phase Functional Implications: Prolonged oral transit Pharyngeal  Phase Impairments: Suspected delayed Swallow    Nectar Thick Nectar Thick Liquid: Not tested   Honey Thick Honey Thick Liquid: Not tested   Puree Puree: Impaired Presentation: Spoon Oral Phase Functional Implications: Prolonged oral transit   Solid     Solid: Not tested      Blenda Mounts Laurice 10/06/2020,4:11 PM Marchelle Folks L. Samson Frederic, MA CCC/SLP Acute Rehabilitation Services Office number [phone] Pager [phone]

## 2020-10-06 NOTE — Evaluation (Signed)
Occupational Therapy Evaluation Patient Details Name: Justin Hawkins MRN: [id number] DOB: Aug 16, 1941 Today's Date: 10/06/2020    History of Present Illness Pt is 80 yo male with PMH including dementia, DM2, HTN, and previous CVA who was brought to the emergency department after patient has not been eating well over the last 2 days with cold symptoms with persistent cough.  Patient with a PEG tube, family states does not usually use the PEG for nutrition, only when he does not eat anything. Pt found to be COVID 19 + and admitted with SIRS.   Clinical Impression   Pt admitted with above. He demonstrates the below listed deficits and will benefit from continued OT to maximize safety and independence with BADLs.  Pt presents to OT with generalized weakness, decreased activity tolerance, impaired balance, impaired cognition.   He currently requires mod A for bed mobility and functional transfers.  He was able to self feed after set up, but noted with frequent coughing with wet voice and holding food in mouth.  Pt lived with his family PTA, who assists him with most ADLs.   Will follow acutely to maximize his independence with plan to return home with family.       Follow Up Recommendations  No OT follow up;Supervision/Assistance - 24 hour (family has been providing assist with ADLs)    Equipment Recommendations  None recommended by OT    Recommendations for Other Services       Precautions / Restrictions Precautions Precautions: Fall      Mobility Bed Mobility Overal bed mobility: Needs Assistance Bed Mobility: Supine to Sit     Supine to sit: Mod assist     General bed mobility comments: assist to initiate moveement and to lift trunk from bed    Transfers Overall transfer level: Needs assistance Equipment used: Rolling walker (2 wheeled) Transfers: Sit to/from [employer] Sit to Stand: Mod assist Stand pivot transfers: Mod assist       General transfer  comment: assist to help pt initiate activity, and to guide pt to chair as well as to provide support for balance    Balance Overall balance assessment: Needs assistance Sitting-balance support: No upper extremity supported Sitting balance-Leahy Scale: Fair Sitting balance - Comments: min guard assist for static sitting EOB   Standing balance support: Single extremity supported Standing balance-Leahy Scale: Poor Standing balance comment: mod A and UE support                           ADL either performed or assessed with clinical judgement   ADL Overall ADL's : Needs assistance/impaired Eating/Feeding: Set up;Supervision/ safety;Sitting Eating/Feeding Details (indicate cue type and reason): Pt able to self feed with set up, but moves slowly.  He was noted to [facility] food for an extended period of time and not initiate a swallow - pocketing noted.  Pt coughing and with wet sounding voice - MD, RN, and SLP notified Grooming: Wash/dry face;Set up;Supervision/safety;Sitting   Upper Body Bathing: Maximal assistance;Sitting   Lower Body Bathing: Maximal assistance;Sit to/from stand   Upper Body Dressing : Total assistance;Sitting   Lower Body Dressing: Total assistance;Sit to/from stand   Toilet Transfer: Moderate assistance;Stand-pivot;Comfort height toilet Toilet Transfer Details (indicate cue type and reason): assist for balance to guide pt toward the toilet Toileting- Clothing Manipulation and Hygiene: Total assistance;Sit to/from stand       Functional mobility during ADLs: Moderate assistance  Vision   Additional Comments: Pt unable to engage in visual assessment     Perception     Praxis      Pertinent Vitals/Pain Pain Assessment: No/denies pain     Hand Dominance     Extremity/Trunk Assessment Upper Extremity Assessment Upper Extremity Assessment: Generalized weakness;Difficult to assess due to impaired cognition   Lower Extremity  Assessment Lower Extremity Assessment: Defer to PT evaluation   Cervical / Trunk Assessment Cervical / Trunk Assessment: Kyphotic   Communication Communication Communication: Expressive difficulties (slurred speech)   Cognition Arousal/Alertness: Awake/alert Behavior During Therapy: Flat affect Overall Cognitive Status: No family/caregiver present to determine baseline cognitive functioning                                 General Comments: Pt with h/o dementia.  he will follow simple commands with multimodal cues   General Comments  VSS with pt on RA    Exercises     Shoulder Instructions      Home Living Family/patient expects to be discharged to:: Private residence Living Arrangements: Children   Type of Home: House Home Access: Stairs to enter [employer] of Steps: 3 Entrance Stairs-Rails: Right;Left Home Layout: Two level;Able to live on main level with bedroom/bathroom Alternate Level Stairs-Number of Steps: Can stay on first floor but daughter in law reports pt can go upstairs   Bathroom Shower/Tub: Chief Strategy Officer: Handicapped height     Home Equipment: Shower seat;Hospital bed;Walker - 2 wheels;Toilet riser;Other (comment)   Additional Comments: PT Spoke with son and daughter in law on phone who provided home environment and PLOF      Prior Functioning/Environment Level of Independence: Needs assistance  Gait / Transfers Assistance Needed: Walks around in house without AD (refuses RW) with min guard/assist; daughter in law assist; reports he is stiff and needs assist initially but progresses and able to move around in house and perform stairs. ADL's / Homemaking Assistance Needed: Has assist with ADLs, reports he will do toielting ADLs (daughter in law assist); daughter in law reports pt states "I've retired, I took care of my kids now it's time for them to take care of me."            OT Problem List: Decreased  strength;Decreased activity tolerance;Impaired balance (sitting and/or standing);Decreased coordination;Decreased cognition;Decreased safety awareness;Decreased knowledge of use of DME or AE      OT Treatment/Interventions: Self-care/ADL training;Neuromuscular education;DME and/or AE instruction;Therapeutic activities;Cognitive remediation/compensation;Patient/family education;Balance training    OT Goals(Current goals can be found in the care plan section) Acute Rehab OT Goals OT Goal Formulation: Patient unable to participate in goal setting Time For Goal Achievement: 10/20/20 Potential to Achieve Goals: Fair ADL Goals Pt Will Perform Grooming: with min assist;standing Pt Will Transfer to Toilet: with min assist;ambulating;regular height toilet;bedside commode;grab bars Pt Will Perform Toileting - Clothing Manipulation and hygiene: with min assist;sit to/from stand  OT Frequency: Min 2X/week   Barriers to D/C:            Co-evaluation              AM-PAC OT "6 Clicks" Daily Activity     Outcome Measure Help from another person eating meals?: A Little Help from another person taking care of personal grooming?: A Lot Help from another person toileting, which includes using toliet, bedpan, or urinal?: A Lot Help from another person bathing (including washing,  rinsing, drying)?: A Lot Help from another person to put on and taking off regular upper body clothing?: A Lot Help from another person to put on and taking off regular lower body clothing?: Total 6 Click Score: 12   End of Session Equipment Utilized During Treatment: Gait belt Nurse Communication: Mobility status;Other (comment) (pocketing food and coughing)  Activity Tolerance: Patient tolerated treatment well Patient left: in chair;with call bell/phone within reach;with chair alarm set  OT Visit Diagnosis: Unsteadiness on feet (R26.81);Cognitive communication deficit (R41.841)                Time: 2876-8115 OT  Time Calculation (min): 68 min Charges:  OT General Charges $OT Visit: 1 Visit OT Evaluation $OT Eval Moderate Complexity: 1 Mod OT Treatments $Self Care/Home Management : 38-52 mins  Eber Jones., OTR/L Acute Rehabilitation Services Pager [phone] Office [phone]   Jeani Hawking M 10/06/2020, 4:54 PM

## 2020-10-07 DIAGNOSIS — R627 Adult failure to thrive: Secondary | ICD-10-CM | POA: Diagnosis present

## 2020-10-07 DIAGNOSIS — R6510 Systemic inflammatory response syndrome (SIRS) of non-infectious origin without acute organ dysfunction: Secondary | ICD-10-CM | POA: Diagnosis not present

## 2020-10-07 DIAGNOSIS — F0390 Unspecified dementia without behavioral disturbance: Secondary | ICD-10-CM | POA: Diagnosis present

## 2020-10-07 LAB — BASIC METABOLIC PANEL
Anion gap: 8 (ref 5–15)
BUN: 14 mg/dL (ref 8–23)
CO2: 24 mmol/L (ref 22–32)
Calcium: 9.7 mg/dL (ref 8.9–10.3)
Chloride: 99 mmol/L (ref 98–111)
Creatinine, Ser: 0.53 mg/dL — ABNORMAL LOW (ref 0.61–1.24)
GFR, Estimated: >60 mL/min (ref >60)
Glucose, Bld: 260 mg/dL — ABNORMAL HIGH (ref 70–99)
Potassium: 4 mmol/L (ref 3.5–5.1)
Sodium: 131 mmol/L — ABNORMAL LOW (ref 135–145)

## 2020-10-07 LAB — GLUCOSE, CAPILLARY
Glucose-Capillary: 204 mg/dL — ABNORMAL HIGH (ref 70–99)
Glucose-Capillary: 162 mg/dL — ABNORMAL HIGH (ref 70–99)
Glucose-Capillary: 218 mg/dL — ABNORMAL HIGH (ref 70–99)

## 2020-10-07 MED ORDER — POLYETHYLENE GLYCOL 3350 17 G PO PACK: 17.0000 g | PACK | Freq: Every day | ORAL | 0 refills | Status: AC

## 2020-10-07 MED ORDER — INSULIN ASPART 100 UNIT/ML ~~LOC~~ SOLN: 0.0000 [IU] | Freq: Every day | SUBCUTANEOUS | Status: DC

## 2020-10-07 MED ORDER — SODIUM CHLORIDE 0.9 % IV SOLN: INTRAVENOUS | Status: DC

## 2020-10-07 MED ORDER — ENSURE ENLIVE PO LIQD: 237.0000 mL | Freq: Two times a day (BID) | ORAL | 0 refills | Status: DC

## 2020-10-07 MED ORDER — INSULIN ASPART 100 UNIT/ML ~~LOC~~ SOLN: 0.0000 [IU] | Freq: Three times a day (TID) | SUBCUTANEOUS | Status: DC

## 2020-10-07 MED ORDER — OSMOLITE 1.5 CAL PO LIQD: 237.0000 mL | Freq: Four times a day (QID) | ORAL | 0 refills | Status: AC

## 2020-10-07 MED ORDER — INSULIN ASPART 100 UNIT/ML ~~LOC~~ SOLN
0.0000 [IU] | Freq: Three times a day (TID) | SUBCUTANEOUS | Status: DC
  Administered 2020-10-07: 1 [IU] via SUBCUTANEOUS
  Administered 2020-10-07: 2 [IU] via SUBCUTANEOUS

## 2020-10-07 MED ORDER — SENNOSIDES-DOCUSATE SODIUM 8.6-50 MG PO TABS: 2.0000 | ORAL_TABLET | Freq: Two times a day (BID) | ORAL | 2 refills | Status: AC

## 2020-10-07 MED ORDER — PROSOURCE TF PO LIQD: 45.0000 mL | Freq: Two times a day (BID) | ORAL | 0 refills | Status: DC

## 2020-10-07 NOTE — Discharge Summary (Signed)
Physician Discharge Summary  KHIEM GARGIS ZOX:[id number] DOB: March 22, 1941 DOA: 10/03/2020  PCP: [facility]  Admit date: 10/03/2020 Discharge date: 10/07/2020  Admitted From:  Disposition:    Recommendations for Outpatient Follow-up:  1. Follow up with PCP in 1-2 weeks 2. Referral placed for outpatient palliative care to follow 3. Recommend continue tube feeds to maintain nutrition given his pocketing of food and poor oral intake 4. Continue further discussion regarding goals of care and medical decision making given ultimately poor prognosis from underlying dementia with adult failure to thrive  Home Health: PT/OT/RN/SLP Equipment/Devices: PEG, previously placed  Discharge Condition: Stable, although ultimately poor prognosis given dementia and adult failure to thrive CODE STATUS: Full code Diet recommendation: Regular diet as tolerates  History of present illness:  MATT DELPIZZO is a 80 year-old male with past medical history significant for dementia, type 2 diabetes mellitus, essential hypertension, previous CVA who was brought to the emergency department after patient has not been eating well over the last 2 days with cold symptoms with persistent cough.  Patient with a PEG tube, family states does not usually use the PEG for nutrition, only when he does not eat anything.  No reported nausea, vomiting, diarrhea, chest pain or shortness of breath.  Patient apparently not "acting like himself" which is a change and thus he was brought to the ED for further evaluation.  In the ED, temperature 98.4, HR 104, RR 24, BP 141/79, SPO2 98% on room air.  Sodium 127, potassium 4.4, chloride 91, CO2 24, glucose 165, BUN 25, creatinine 0.83, AST 28, ALT 22, total bilirubin 0.4.  WBC 3.3, hemoglobin 12.1, platelet 185.  Troponin 45>10.  Lactic acid 4.2>2.8.  INR 0.9.  TSH 0.901, free T4 1.37.  Covid-19 PCR positive.  Influenza A/B PCR negative.  Chest x-ray with low lung volumes, otherwise  no acute cardiopulmonary disease process.  Patient was started on Zosyn by ED physician for concern of underlying infectious etiology.  Hospitalist service consulted for further evaluation and treatment adult failure to thrive, and COVID-19 viral infection.  Hospital course:  SIRS, POA Acute Covid-19 viral infection during the ongoing Covid 28 Pandemic - POA Patient presenting from home with poor oral intake, failure to thrive.  Was found to be incidentally positive for COVID-19 PCR.  Son reports that patient is vaccinated, has received 2 doses of the Pfizer vaccine, but has not received a booster as son reports it is not that time as of yet.  WBC and procalcitonin within normal limits, discontinued Zosyn.  Completed 3-day course of remdesivir on 10/05/2020.  Patient oxygenating well on room air.  Continue isolation precautions per CDC guidelines.  Recommend patient receive booster shot 45 days following positive COVID-19 test.  The treatment plan and use of medications and known side effects were discussed with patient/family. Some of the medications used are based on case reports/anecdotal data.  All other medications being used in the management of COVID-19 based on limited study data.  Complete risks and long-term side effects are unknown, however in the best clinical judgment they seem to be of some benefit.  Patient wanted to proceed with treatment options provided.  Constipation: resolved CT abdomen/pelvis with contrast with no acute intra-abdominal abnormality, gastrotomy tube in place with large colonic stool burden with diffuse mild distention. Received SMOG enema on 1/11 with several large bowel movements.  Continue Senokot 2 tabs BID and MiraLAX daily.  Adult failure to thrive Patient presenting from home with poor oral  intake in the preceding 2 days with associated cold symptoms and persistent cough.  Was found to be incidentally positive for COVID-19 as above.  Patient also with history  of dementia and chronic PEG tube in place; although patient only utilizes PEG tube occasionally when he does not eat.  Seen by speech therapy, and noted by nursing to be pocketing foods with poor oral intake.  Seen by nutrition and started on Osmolite 1.5 cal 230 mL per tube 4 times daily, Prosource 45 mL twice daily and Ensure twice daily between meals.  May eat a regular diet as he tolerates, but likely will not maintain nutrition this route alone.  Palliative care was consulted and will have palliative care follow outpatient.  Recommend further discussions regarding CODE STATUS, goals of care and medical decision making as this patient with poor prognosis given his advanced dementia and adult failure to thrive.  Hyponatremia Sodium 127 on admission, likely hypovolemic hyponatremia in the setting of poor oral intake.  Sodium 131 time of discharge.  Recommend repeat BMP in 1 week.  Hypokalemia Repleted during hospitalization.  Potassium 4.0 at time of discharge.  Recommend BMP 1 week.  Dementia Patient is pleasantly confused. --Keep blinds open and lights on during daylight hours --Minimize the use of opioids/benzodiazepines  Type 2 diabetes mellitus Hemoglobin A1c 7.7.  On metformin 1000 mg p.o. daily at home.  Severe protein calorie malnutrition Patient with significant muscle wasting, thin and cachectic in appearance.  Has had poor appetite over the last 2 days, but suspect this has been going ongoing for some time.  Has a PEG tube in place, only utilizes per family when he does not eat much.  Nutrition was consulted and patient was started on tube feeds due to poor oral intake related to adult failure to thrive/dementia.  Weakness/deconditioning/debility: Patient currently at home with son.  Was apparently in rehab 4 years ago. Has walker at home, but does not utilize and usually walks around the house without much assistance at baseline per son's report.  Patient will discharge with  home health PT/OT/SLP/RN.  Discharge Diagnoses:  Active Problems:   COVID-19 virus infection   Normocytic normochromic anemia   Dementia without behavioral disturbance (HCC)   Adult failure to thrive    Discharge Instructions  Discharge Instructions    Amb Referral to Palliative Care   Complete by: As directed    Call MD for:  difficulty breathing, headache or visual disturbances   Complete by: As directed    Call MD for:  extreme fatigue   Complete by: As directed    Call MD for:  persistant dizziness or light-headedness   Complete by: As directed    Call MD for:  persistant nausea and vomiting   Complete by: As directed    Call MD for:  severe uncontrolled pain   Complete by: As directed    Call MD for:  temperature >100.4   Complete by: As directed    Diet - low sodium heart healthy   Complete by: As directed    Increase activity slowly   Complete by: As directed      Allergies as of 10/07/2020   No Known Allergies     Medication List    TAKE these medications   atorvastatin 10 MG tablet Commonly known as: LIPITOR Take 10 mg by mouth at bedtime.   cyproheptadine 4 MG tablet Commonly known as: PERIACTIN Take 4 mg by mouth 2 (two) times daily.   feeding  supplement (OSMOLITE 1.5 CAL) Liqd Place 237 mLs into feeding tube 4 (four) times daily.   feeding supplement (PROSource TF) liquid Place 45 mLs into feeding tube 2 (two) times daily.   feeding supplement Liqd Take 237 mLs by mouth 2 (two) times daily between meals.   metFORMIN 1000 MG tablet Commonly known as: GLUCOPHAGE Take 1,000 mg by mouth daily.   mirtazapine 30 MG tablet Commonly known as: REMERON Take 30 mg by mouth at bedtime.   polyethylene glycol 17 g packet Commonly known as: MIRALAX / GLYCOLAX Place 17 g into feeding tube daily. Start taking on: October 08, 2020   senna-docusate 8.6-50 MG tablet Commonly known as: Senokot-S Take 2 tablets by mouth 2 (two) times daily.        Follow-up Information    [facility] Schedule an appointment as soon as possible for a visit in 1 week(s).   Contact information: [address] [place] [address] [phone]              No Known Allergies  Consultations:  Palliative care   Procedures/Studies: CT ABDOMEN PELVIS W CONTRAST  Result Date: 10/04/2020 CLINICAL DATA:  80 year old male with concern for abdominal abscess or infection. EXAM: CT ABDOMEN AND PELVIS WITH CONTRAST TECHNIQUE: Multidetector CT imaging of the abdomen and pelvis was performed using the standard protocol following bolus administration of intravenous contrast. CONTRAST:  75mL OMNIPAQUE IOHEXOL 300 MG/ML  SOLN COMPARISON:  None. FINDINGS: Lower chest: Scattered bibasilar ground-glass opacities and consolidative., Most prominent in the dependent lower lobes. No pleural effusions. The heart is normal in size. No pericardial effusion. Hepatobiliary: The liver is normal in size, contour, and attenuation. No intra or extrahepatic biliary ductal dilation. The gallbladder is present and unremarkable. Pancreas: Atrophic, not well visualized. Spleen: Normal in size without focal abnormality. Adrenals/Urinary Tract: Adrenal glands are unremarkable. Few scattered simple cortical cyst in the left kidney, largest in the inferior pole measuring up to 5 cm. Kidneys are otherwise normal, without renal calculi, focal lesion, or hydronephrosis. Bladder is unremarkable. Stomach/Bowel: Gastrostomy tube in place with the retention balloon along the posterior wall of the gastric body. The majority of the small bowel in the left hemiabdomen, decompressed. Large colonic stool burden with diffuse mild distension. The appendix is not definitively visualized. Vascular/Lymphatic: No significant vascular findings are present. No enlarged abdominal or pelvic lymph nodes. Reproductive: Prostatomegaly, measuring up to 6.1 cm in axial dimension, with protrusion into the  base of the bladder. Other: No abdominal wall hernia or abnormality. No abdominopelvic ascites. Musculoskeletal: Multilevel degenerative changes of the thoracolumbar spine associated sigmoid curvature. No evidence of acute fracture IMPRESSION: 1. Scattered bibasilar, peripheral predominant independent ground-glass and consolidative opacities as could be seen with multi focal pneumonia. 2. No acute intra-abdominal abnormality. 3. Gastrostomy tube in place with the retention balloon along the posterior wall of the gastric body. If the tube is malfunctioning or leaking along the track, consider retraction of the gastrostomy tube to position the retention balloon along the anterior aspect of the stomach followed by cinching the external bumper down to the level of the skin. Marliss Cootsylan Suttle, MD [facility] Electronically Signed   By: Marliss Cootsylan  Suttle MD   On: 10/04/2020 09:21   DG Chest Portable 1 View  Result Date: 10/03/2020 CLINICAL DATA:  Infection. Increased weakness and cough for 2 days. Fever. EXAM: PORTABLE CHEST 1 VIEW COMPARISON:  None. FINDINGS: Lung volumes are low. No focal airspace disease.  Normal heart size. Mild aortic tortuosity. Otherwise normal mediastinal contours. No pulmonary edema, pleural fluid or pneumothorax. Multiple skin folds project over the chest. Scoliotic curvature of the spine. No acute osseous abnormalities are seen. IMPRESSION: Low lung volumes without acute abnormality. Electronically Signed   By: Narda RutherfordMelanie  Sanford M.D.   On: 10/03/2020 21:09      Subjective: Patient seen and examined at bedside, sitting in bedside chair.  No complaints.  Pleasantly confused.  No family present.  Attempted to contact patient's son, Marilu FavreClarence via telephone regarding discharge home today unsuccessful.  I did discuss this plan with him yesterday and he was with understanding.  Patient without other complaints or concerns at this time.  No acute  concerns per nursing staff.  Discharge Exam: Vitals:   10/07/20 0746 10/07/20 0814  BP: 128/69   Pulse: 73   Resp: 19   Temp: (!) 97.2 F (36.2 C)   SpO2: 99% 98%   Vitals:   10/06/20 1940 10/07/20 0512 10/07/20 0746 10/07/20 0814  BP: 125/68  128/69   Pulse: 74  73   Resp: 16  19   Temp: 97.9 F (36.6 C) 97.7 F (36.5 C) (!) 97.2 F (36.2 C)   TempSrc:  Oral Axillary   SpO2: 100%  99% 98%  Weight:      Height:        General: Pt is alert, awake, not in acute distress, pleasantly confused, thin/cachectic with significant muscle wasting noted Cardiovascular: RRR, S1/S2 +, no rubs, no gallops Respiratory: CTA bilaterally, no wheezing, no rhonchi, oxygenating well on room air Abdominal: Soft, NT, ND, bowel sounds +, PEG tube noted Extremities: no edema, no cyanosis    The results of significant diagnostics from this hospitalization (including imaging, microbiology, ancillary and laboratory) are listed below for reference.     Microbiology: Recent Results (from the past 240 hour(s))  Culture, blood (Routine x 2)     Status: None (Preliminary result)   Collection Time: 10/03/20  8:09 PM   Specimen: BLOOD LEFT HAND  Result Value Ref Range Status   Specimen Description BLOOD LEFT HAND  Final   Special Requests   Final    BOTTLES DRAWN AEROBIC ONLY Blood Culture results may not be optimal due to an inadequate volume of blood received in culture bottles   Culture   Final    NO GROWTH 4 DAYS Performed at [facility], [address] [place], [place] [address]    Report Status PENDING  Incomplete  Resp Panel by RT-PCR (Flu A&B, Covid) Nasopharyngeal Swab     Status: Abnormal   Collection Time: 10/03/20  8:15 PM   Specimen: Nasopharyngeal Swab; Nasopharyngeal(NP) swabs in vial transport medium  Result Value Ref Range Status   SARS Coronavirus 2 by RT PCR POSITIVE (A) NEGATIVE Final    Comment: RESULT CALLED TO, READ BACK BY AND VERIFIED WITH: RN ALEXIS DAVIDSON BY  MESSAN H. AT 2155 ON 10/03/2020 (NOTE) SARS-CoV-2 target nucleic acids are DETECTED.  The SARS-CoV-2 RNA is generally detectable in upper respiratory specimens during the acute phase of infection. Positive results are indicative of the presence of the identified virus, but do not rule out bacterial infection or co-infection with other pathogens not detected by the test. Clinical correlation with patient history and other diagnostic information is necessary to determine patient infection status. The expected result is Negative.  Fact Sheet for Patients: BloggerCourse.comhttps://www.fda.gov/media/152166/download  Fact Sheet for Healthcare Providers: SeriousBroker.ithttps://www.fda.gov/media/152162/download  This test is not yet  approved or cleared by the [place] and  has been authorized for detection and/or diagnosis of SARS-CoV-2 by FDA under an Emergency Use Authorization (EUA).  This EUA will remain in effect (meanin g this test can be used) for the duration of  the COVID-19 declaration under Section 564(b)(1) of the Act, 21 U.S.C. section 360bbb-3(b)(1), unless the authorization is terminated or revoked sooner.     Influenza A by PCR NEGATIVE NEGATIVE Final   Influenza B by PCR NEGATIVE NEGATIVE Final    Comment: (NOTE) The Xpert Xpress SARS-CoV-2/FLU/RSV plus assay is intended as an aid in the diagnosis of influenza from Nasopharyngeal swab specimens and should not be used as a sole basis for treatment. Nasal washings and aspirates are unacceptable for Xpert Xpress SARS-CoV-2/FLU/RSV testing.  Fact Sheet for Patients: BloggerCourse.com  Fact Sheet for Healthcare Providers: SeriousBroker.it  This test is not yet approved or cleared by the [place] FDA and has been authorized for detection and/or diagnosis of SARS-CoV-2 by FDA under an Emergency Use Authorization (EUA). This EUA will remain in effect (meaning this test can be used) for the  duration of the COVID-19 declaration under Section 564(b)(1) of the Act, 21 U.S.C. section 360bbb-3(b)(1), unless the authorization is terminated or revoked.  Performed at [facility], [address], [place], [place] [address]   Culture, blood (Routine x 2)     Status: None (Preliminary result)   Collection Time: 10/03/20  8:19 PM   Specimen: BLOOD RIGHT FOREARM  Result Value Ref Range Status   Specimen Description BLOOD RIGHT FOREARM  Final   Special Requests   Final    BOTTLES DRAWN AEROBIC AND ANAEROBIC Blood Culture adequate volume   Culture   Final    NO GROWTH 4 DAYS Performed at [facility], [address], [place], [place] [address]    Report Status PENDING  Incomplete     Labs: BNP (last 3 results) Recent Labs    10/03/20 2250  BNP 46.0   Basic Metabolic Panel: Recent Labs  Lab 10/03/20 2010 10/04/20 0406 10/05/20 0951 10/06/20 0415 10/07/20 0146  NA 127*  --  133* 135 131*  K 4.4  --  3.4* 3.6 4.0  CL 91*  --  99 103 99  CO2 24  --  26 25 24   GLUCOSE 165*  --  163* 187* 260*  BUN 25*  --  11 11 14   CREATININE 0.83 0.68 0.61 0.54* 0.53*  CALCIUM 9.7  --  9.3 9.6 9.7  MG  --   --   --  1.7  --   PHOS  --   --   --  2.0*  --    Liver Function Tests: Recent Labs  Lab 10/03/20 2010 10/05/20 0951 10/06/20 0415  AST 28 21 20   ALT 22 17 19   ALKPHOS 66 56 55  BILITOT 0.4 0.3 0.3  PROT 5.9* 4.7* 5.0*  ALBUMIN 2.9* 2.3* 2.2*   No results for input(s): LIPASE, AMYLASE in the last 168 hours. No results for input(s): AMMONIA in the last 168 hours. CBC: Recent Labs  Lab 10/03/20 2010 10/04/20 0406 10/05/20 0500 10/06/20 0415  WBC 3.3* 4.2 7.7 4.6  NEUTROABS 2.5  --  6.5 3.7  HGB 12.1* 12.7* 10.7* 9.6*  HCT 35.2* 36.6* 32.7* 30.2*  MCV 85.6 85.9 88.6 93.8  PLT 185 179 169 147*   Cardiac Enzymes: No results for input(s): CKTOTAL, CKMB, CKMBINDEX, TROPONINI in the last 168  hours. BNP: Invalid input(s): POCBNP CBG: Recent Labs   Lab 10/06/20 1618 10/06/20 1941 10/06/20 2336 10/07/20 0413 10/07/20 0750  GLUCAP 209* 232* 172* 204* 162*   D-Dimer Recent Labs    10/05/20 0500 10/06/20 0415  DDIMER 0.43 0.61*   Hgb A1c No results for input(s): HGBA1C in the last 72 hours. Lipid Profile No results for input(s): CHOL, HDL, LDLCALC, TRIG, CHOLHDL, LDLDIRECT in the last 72 hours. Thyroid function studies No results for input(s): TSH, T4TOTAL, T3FREE, THYROIDAB in the last 72 hours.  Invalid input(s): FREET3 Anemia work up No results for input(s): VITAMINB12, FOLATE, FERRITIN, TIBC, IRON, RETICCTPCT in the last 72 hours. Urinalysis    Component Value Date/Time   COLORURINE STRAW (A) 10/04/2020 0408   APPEARANCEUR CLEAR 10/04/2020 0408   LABSPEC 1.010 10/04/2020 0408   PHURINE 7.0 10/04/2020 0408   GLUCOSEU 50 (A) 10/04/2020 0408   HGBUR NEGATIVE 10/04/2020 0408   BILIRUBINUR NEGATIVE 10/04/2020 0408   KETONESUR NEGATIVE 10/04/2020 0408   PROTEINUR NEGATIVE 10/04/2020 0408   NITRITE NEGATIVE 10/04/2020 0408   LEUKOCYTESUR NEGATIVE 10/04/2020 0408   Sepsis Labs Invalid input(s): PROCALCITONIN,  WBC,  LACTICIDVEN Microbiology Recent Results (from the past 240 hour(s))  Culture, blood (Routine x 2)     Status: None (Preliminary result)   Collection Time: 10/03/20  8:09 PM   Specimen: BLOOD LEFT HAND  Result Value Ref Range Status   Specimen Description BLOOD LEFT HAND  Final   Special Requests   Final    BOTTLES DRAWN AEROBIC ONLY Blood Culture results may not be optimal due to an inadequate volume of blood received in culture bottles   Culture   Final    NO GROWTH 4 DAYS Performed at [facility], [address] [place], [place] [address]    Report Status PENDING  Incomplete  Resp Panel by RT-PCR (Flu A&B, Covid) Nasopharyngeal Swab     Status: Abnormal   Collection Time: 10/03/20  8:15 PM   Specimen: Nasopharyngeal Swab; Nasopharyngeal(NP) swabs in vial transport medium  Result Value  Ref Range Status   SARS Coronavirus 2 by RT PCR POSITIVE (A) NEGATIVE Final    Comment: RESULT CALLED TO, READ BACK BY AND VERIFIED WITH: RN ALEXIS DAVIDSON BY MESSAN H. AT 2155 ON 10/03/2020 (NOTE) SARS-CoV-2 target nucleic acids are DETECTED.  The SARS-CoV-2 RNA is generally detectable in upper respiratory specimens during the acute phase of infection. Positive results are indicative of the presence of the identified virus, but do not rule out bacterial infection or co-infection with other pathogens not detected by the test. Clinical correlation with patient history and other diagnostic information is necessary to determine patient infection status. The expected result is Negative.  Fact Sheet for Patients: BloggerCourse.com  Fact Sheet for Healthcare Providers: SeriousBroker.it  This test is not yet approved or cleared by the [place] FDA and  has been authorized for detection and/or diagnosis of SARS-CoV-2 by FDA under an Emergency Use Authorization (EUA).  This EUA will remain in effect (meanin g this test can be used) for the duration of  the COVID-19 declaration under Section 564(b)(1) of the Act, 21 U.S.C. section 360bbb-3(b)(1), unless the authorization is terminated or revoked sooner.     Influenza A by PCR NEGATIVE NEGATIVE Final   Influenza B by PCR NEGATIVE NEGATIVE Final    Comment: (NOTE) The Xpert Xpress SARS-CoV-2/FLU/RSV plus assay is intended as an aid in the diagnosis of influenza from Nasopharyngeal swab specimens and should not  be used as a sole basis for treatment. Nasal washings and aspirates are unacceptable for Xpert Xpress SARS-CoV-2/FLU/RSV testing.  Fact Sheet for Patients: BloggerCourse.com  Fact Sheet for Healthcare Providers: SeriousBroker.it  This test is not yet approved or cleared by the [place] FDA and has been authorized for  detection and/or diagnosis of SARS-CoV-2 by FDA under an Emergency Use Authorization (EUA). This EUA will remain in effect (meaning this test can be used) for the duration of the COVID-19 declaration under Section 564(b)(1) of the Act, 21 U.S.C. section 360bbb-3(b)(1), unless the authorization is terminated or revoked.  Performed at [facility], [address] [place], [place] [address]   Culture, blood (Routine x 2)     Status: None (Preliminary result)   Collection Time: 10/03/20  8:19 PM   Specimen: BLOOD RIGHT FOREARM  Result Value Ref Range Status   Specimen Description BLOOD RIGHT FOREARM  Final   Special Requests   Final    BOTTLES DRAWN AEROBIC AND ANAEROBIC Blood Culture adequate volume   Culture   Final    NO GROWTH 4 DAYS Performed at [facility], [address] [place], [place] [address]    Report Status PENDING  Incomplete     Time coordinating discharge: Over 30 minutes  SIGNED:   Alvira Philips [place], DO  [facility] 10/07/2020, 9:59 AM

## 2020-10-07 NOTE — TOC Transition Note (Signed)
Transition of Care [facility]) - CM/SW Discharge Note   Patient Details  Name: CHINONSO LINKER MRN: [id number] Date of Birth: [birth year]  [facility] CM/SW Contact:  Lockie Pares, RN Phone Number: 10/07/2020, 11:47 AM   Clinical Narrative:     Son [name] called and left message about DC needs, he returned call and wanted [facility], however they do not provide the services needed. Contacted [facility] for service provision. He will pick up patient for transport home, but asked that RN call him with DC instructions.Message to RN received.  Final next level of care: Home w Home Health Services Barriers to Discharge: No Barriers Identified   Patient Goals and CMS Choice        Discharge Placement                       Discharge Plan and Services                          HH Arranged: RN,PT,OT,Speech Therapy HH Agency: [facility] Date [place] Agency Contacted: 10/07/20 Time HH Agency Contacted: [phone] Representative spoke with at [facility] Agency: Lorenza Chick  Social Determinants of Health (SDOH) Interventions     Readmission Risk Interventions No flowsheet data found.

## 2020-10-07 NOTE — Discharge Instructions (Signed)
10 Things You Can Do to Manage Your COVID-19 Symptoms at Home If you have possible or confirmed COVID-19: 1. Stay home except to get medical care. 2. Monitor your symptoms carefully. If your symptoms get worse, call your healthcare provider immediately. 3. Get rest and stay hydrated. 4. If you have a medical appointment, call the healthcare provider ahead of time and tell them that you have or may have COVID-19. 5. For medical emergencies, call 911 and notify the dispatch personnel that you have or may have COVID-19. 6. Cover your cough and sneezes with a tissue or use the inside of your elbow. 7. Wash your hands often with soap and water for at least 20 seconds or clean your hands with an alcohol-based hand sanitizer that contains at least 60% alcohol. 8. As much as possible, stay in a specific room and away from other people in your home. Also, you should use a separate bathroom, if available. If you need to be around other people in or outside of the home, wear a mask. 9. Avoid sharing personal items with other people in your household, like dishes, towels, and bedding. 10. Clean all surfaces that are touched often, like counters, tabletops, and doorknobs. Use household cleaning sprays or wipes according to the label instructions. [employer] 04/09/2020 This information is not intended to replace advice given to you by your health care provider. Make sure you discuss any questions you have with your health care provider. Document Revised: 07/26/2020 Document Reviewed: 07/26/2020 Elsevier Patient Education  2021 Elsevier Inc.  https://point-of-care.elsevierperformancemanager.com/skills">  Dementia Caregiver Guide Dementia is a term used to describe a number of symptoms that affect memory and thinking. The most common symptoms include:  Memory loss.  Trouble with language and communication.  Trouble concentrating.  Poor judgment and problems with reasoning.  Wandering from  home or public places.  Extreme anxiety or depression.  Being suspicious or having angry outbursts and accusations.  Child-like behavior and language. Dementia can be frightening and confusing. And taking care of someone with dementia can be challenging. This guide provides tips to help you when providing care for a person with dementia. How to help manage lifestyle changes Dementia usually gets worse slowly over time. In the early stages, people with dementia can stay independent and safe with some help. In later stages, they need help with daily tasks such as dressing, grooming, and using the bathroom. There are actions you can take to help a person manage his or her life while living with this condition. Communicating  When the person is talking or seems frustrated, make eye contact and hold the person's hand.  Ask specific questions that need yes or no answers.  Use simple words, short sentences, and a calm voice. Only give one direction at a time.  When offering choices, limit the person to just one or two.  Avoid correcting the person in a negative way.  If the person is struggling to find the right words, gently try to help him or her. Preventing injury  Keep floors clear of clutter. Remove rugs, magazine racks, and floor lamps.  Keep hallways well lit, especially at night.  Put a handrail and nonslip mat in the bathtub or shower.  Put childproof locks on cabinets that contain dangerous items, such as medicines, alcohol, guns, toxic cleaning items, sharp tools or utensils, matches, and lighters.  For doors to the outside of the house, put the locks in places where the person cannot see or reach them easily. This  will help ensure that the person does not wander out of the house and get lost.  Be prepared for emergencies. Keep a list of emergency phone numbers and addresses in a convenient area.  Remove car keys and lock garage doors so that the person does not try to get in  the car and drive.  Have the person wear a bracelet that tracks locations and identifies the person as having memory problems. This should be worn at all times for safety.   Helping with daily life  Keep the person on track with his or her routine.  Try to identify areas where the person may need help.  Be supportive, patient, calm, and encouraging.  Gently remind the person that adjusting to changes takes time.  Help with the tasks that the person has asked for help with.  Keep the person involved in daily tasks and decisions as much as possible.  Encourage conversation, but try not to get frustrated if the person struggles to find words or does not seem to appreciate your help.   How to recognize stress Look for signs of stress in yourself and in the person you are caring for. If you notice signs of stress, take steps to manage it. Symptoms of stress include:  Feeling anxious, irritable, frustrated, or angry.  Denying that the person has dementia or that his or her symptoms will not improve.  Feeling depressed, hopeless, or unappreciated.  Difficulty sleeping.  Difficulty concentrating.  Developing stress-related health problems.  Feeling like you have too little time for your own life. Follow these instructions at home: Take care of your health Make sure that you and the person you are caring for:  Get regular sleep.  Exercise regularly.  Eat regular, nutritious meals.  Take over-the-counter and prescription medicines only as told by your health care providers.  Drink enough fluid to keep your urine pale yellow.  Attend all scheduled health care appointments.   General instructions  Join a support group with others who are caregivers.  Ask about respite care resources. Respite care can provide short-term care for the person so that you can have a regular break from the stress of caregiving.  Consider any safety risks and take steps to avoid them.  Organize  medicines in a pill box for each day of the week.  Create a plan to handle any legal or financial matters. Get legal or financial advice if needed.  Keep a calendar in a central location to remind the person of appointments or other activities. Where to find support: Many individuals and organizations offer support. These include:  Support groups for people with dementia.  Support groups for caregivers.  Counselors or therapists.  Home health care services.  Adult day care centers. Where to find more information  Centers for Disease Control and Prevention: FootballExhibition.com.br  Alzheimer's Association: LimitLaws.hu  Family Caregiver Alliance: www.caregiver.org  Alzheimer's Foundation of [place]: www.alzfdn.org Contact a health care provider if:  The person's health is rapidly getting worse.  You are no longer able to care for the person.  Caring for the person is affecting your physical and emotional health.  You are feeling depressed or anxious about caring for the person. Get help right away if:  The person threatens himself or herself, you, or anyone else.  You feel depressed or sad, or feel that you want to harm yourself. If you ever feel like your loved one may hurt himself or herself or others, or if he or she  shares thoughts about taking his or her own life, get help right away. You can go to your nearest emergency department or:  Call your local emergency services (911 in the [place]  Call a suicide crisis helpline, such as the National Suicide Prevention Lifeline at (479)076-53441-346-566-9537. This is open 24 hours a day in the [place]  Text the Crisis Text Line at 708-391-9825741741 (in the [place] Summary  Dementia is a term used to describe a number of symptoms that affect memory and thinking.  Dementia usually gets worse slowly over time.  Take steps to reduce the person's risk of injury and to plan for future care.  Caregivers need support, relief from caregiving, and time for their own  lives. This information is not intended to replace advice given to you by your health care provider. Make sure you discuss any questions you have with your health care provider. Document Revised: 01/26/2020 Document Reviewed: 01/26/2020 Elsevier Patient Education  2021 Elsevier Inc.  Failure to Thrive, Adult Failure to thrive is a group of symptoms that affect adults, including the elderly. These symptoms include loss of appetite and weight loss. People who have this condition may do fewer and fewer activities over time. They may lose interest in being with friends, or they may not want to eat or drink. This condition is not a normal part of aging. What are the causes? This condition may be caused by:  A disease, such as dementia, diabetes, cancer, or lung disease.  A health problem, such as a vitamin deficiency or a heart problem.  A disorder, such as depression.  A disability.  Medicines.  Having tooth or mouth problems.  Mistreatment or neglect. In some cases, the cause may not be known. What are the signs or symptoms? Symptoms of this condition include:  Loss of more than 5% of your body weight.  Being more tired than normal after an activity.  Having trouble getting up after sitting.  Loss of appetite.  Not getting out of bed.  Not wanting to do usual activities.  Depression.  Getting infections often.  Bedsores.  Taking a long time to recover after an infection, injury, or a surgery.  Weakness. How is this diagnosed? This condition may be diagnosed with a health history, physical exam, and tests. Your health care provider will ask questions about your health, behavior, and mood, such as:  Has your activity changed?  Do you seem sad?  Are your eating habits different? Tests may also be done. They may include:  Blood tests.  Urine tests.  Imaging tests, such as: ? X-rays. ? CT scan. ? MRI.  Hearing tests.  Vision tests.  Tests to check  thinking ability (cognitive tests).  Activity tests. Your health care provider may want to see if you can do tasks such as bathing and dressing and if you can move around safely. You may be referred to a specialist. How is this treated? Treatment for this condition depends on the cause. It may be treated by:  Treating a disease or disorder that is causing symptoms.  Having talk therapy or taking medicine to treat depression.  Improving diet, such as by eating more often or taking nutritional supplements.  Changing or stopping a medicine.  Having physical or occupational therapy. It often takes a team of health care providers to find the right treatment. Follow these instructions at home:  Take over-the-counter and prescription medicines only as told by your health care provider.  Eat a healthy, well-balanced diet.  Make sure to get enough calories in each meal. Ask your health care provider how many calories you need.  Be physically active. Include strength training as part of your exercise routine. A physical therapist can help to set up an exercise program that is right for you.  Make sure that you are safe at home.  Have a plan for what to do if you become unable to make decisions for yourself.   Contact a health care provider if you:  Are not able to eat well.  Are not able to move around.  Feel very sad or hopeless. Get help right away if:  You have thoughts of ending your life.  You cannot eat or drink.  You do not get out of bed.  Staying at home is no longer safe.  You have a fever. Summary  Failure to thrive is a group of symptoms that affect adults, including the elderly.  Symptoms include loss of appetite and weight loss.  Take over-the-counter and prescription medicines only as told by your health care provider.  Eat a healthy, well-balanced diet. Make sure to get enough calories in each meal. Ask your health care provider how many calories you  need.  Be physically active. Include strength training as part of your exercise routine. A physical therapist can help to set up an exercise program that is right for you. This information is not intended to replace advice given to you by your health care provider. Make sure you discuss any questions you have with your health care provider. Document Revised: 05/14/2018 Document Reviewed: 05/14/2018 Elsevier Patient Education  2021 Elsevier Inc. 10 Things You Can Do to Manage Your COVID-19 Symptoms at Home If you have possible or confirmed COVID-19: 11. Stay home except to get medical care. 12. Monitor your symptoms carefully. If your symptoms get worse, call your healthcare provider immediately. 13. Get rest and stay hydrated. 14. If you have a medical appointment, call the healthcare provider ahead of time and tell them that you have or may have COVID-19. 15. For medical emergencies, call 911 and notify the dispatch personnel that you have or may have COVID-19. 16. Cover your cough and sneezes with a tissue or use the inside of your elbow. 17. Wash your hands often with soap and water for at least 20 seconds or clean your hands with an alcohol-based hand sanitizer that contains at least 60% alcohol. 18. As much as possible, stay in a specific room and away from other people in your home. Also, you should use a separate bathroom, if available. If you need to be around other people in or outside of the home, wear a mask. 19. Avoid sharing personal items with other people in your household, like dishes, towels, and bedding. 20. Clean all surfaces that are touched often, like counters, tabletops, and doorknobs. Use household cleaning sprays or wipes according to the label instructions. [email or web address] 04/09/2020 This information is not intended to replace advice given to you by your health care provider. Make sure you discuss any questions you have with your health care provider. Document  Revised: 07/26/2020 Document Reviewed: 07/26/2020 Elsevier Patient Education  2021 Elsevier Inc.    Person Under Monitoring Name: Justin Hawkins  Location: [address] [place] [address]   Infection Prevention Recommendations for Individuals Confirmed to have, or Being Evaluated for, 2019 Novel Coronavirus (COVID-19) Infection Who Receive Care at Home  Individuals who are confirmed to have, or are being evaluated for, COVID-19 should follow  the prevention steps below until a healthcare provider or local or state health department says they can return to normal activities.  Stay home except to get medical care You should restrict activities outside your home, except for getting medical care. Do not go to work, school, or public areas, and do not use public transportation or taxis.  Call ahead before visiting your doctor Before your medical appointment, call the healthcare provider and tell them that you have, or are being evaluated for, COVID-19 infection. This will help the healthcare provider's office take steps to keep other people from getting infected. Ask your healthcare provider to call the local or state health department.  Monitor your symptoms Seek prompt medical attention if your illness is worsening (e.g., difficulty breathing). Before going to your medical appointment, call the healthcare provider and tell them that you have, or are being evaluated for, COVID-19 infection. Ask your healthcare provider to call the local or state health department.  Wear a facemask You should wear a facemask that covers your nose and mouth when you are in the same room with other people and when you visit a healthcare provider. People who live with or visit you should also wear a facemask while they are in the same room with you.  Separate yourself from other people in your home As much as possible, you should stay in a different room from other people in your home. Also,  you should use a separate bathroom, if available.  Avoid sharing household items You should not share dishes, drinking glasses, cups, eating utensils, towels, bedding, or other items with other people in your home. After using these items, you should wash them thoroughly with soap and water.  Cover your coughs and sneezes Cover your mouth and nose with a tissue when you cough or sneeze, or you can cough or sneeze into your sleeve. Throw used tissues in a lined trash can, and immediately wash your hands with soap and water for at least 20 seconds or use an alcohol-based hand rub.  Wash your [employer] your hands often and thoroughly with soap and water for at least 20 seconds. You can use an alcohol-based hand sanitizer if soap and water are not available and if your hands are not visibly dirty. Avoid touching your eyes, nose, and mouth with unwashed hands.   Prevention Steps for Caregivers and Household Members of Individuals Confirmed to have, or Being Evaluated for, COVID-19 Infection Being Cared for in the Home  If you live with, or provide care at home for, a person confirmed to have, or being evaluated for, COVID-19 infection please follow these guidelines to prevent infection:  Follow healthcare provider's instructions Make sure that you understand and can help the patient follow any healthcare provider instructions for all care.  Provide for the patient's basic needs You should help the patient with basic needs in the home and provide support for getting groceries, prescriptions, and other personal needs.  Monitor the patient's symptoms If they are getting sicker, call his or her medical provider and tell them that the patient has, or is being evaluated for, COVID-19 infection. This will help the healthcare provider's office take steps to keep other people from getting infected. Ask the healthcare provider to call the local or state health department.  Limit the number of  people who have contact with the patient  If possible, have only one caregiver for the patient.  Other household members should stay in another home or place of  residence. If this is not possible, they should stay  in another room, or be separated from the patient as much as possible. Use a separate bathroom, if available.  Restrict visitors who do not have an essential need to be in the home.  Keep older adults, very young children, and other sick people away from the patient Keep older adults, very young children, and those who have compromised immune systems or chronic health conditions away from the patient. This includes people with chronic heart, lung, or kidney conditions, diabetes, and cancer.  Ensure good ventilation Make sure that shared spaces in the home have good air flow, such as from an air conditioner or an opened window, weather permitting.  Wash your hands often  Wash your hands often and thoroughly with soap and water for at least 20 seconds. You can use an alcohol based hand sanitizer if soap and water are not available and if your hands are not visibly dirty.  Avoid touching your eyes, nose, and mouth with unwashed hands.  Use disposable paper towels to dry your hands. If not available, use dedicated cloth towels and replace them when they become wet.  Wear a facemask and gloves  Wear a disposable facemask at all times in the room and gloves when you touch or have contact with the patient's blood, body fluids, and/or secretions or excretions, such as sweat, saliva, sputum, nasal mucus, vomit, urine, or feces.  Ensure the mask fits over your nose and mouth tightly, and do not touch it during use.  Throw out disposable facemasks and gloves after using them. Do not reuse.  Wash your hands immediately after removing your facemask and gloves.  If your personal clothing becomes contaminated, carefully remove clothing and launder. Wash your hands after handling  contaminated clothing.  Place all used disposable facemasks, gloves, and other waste in a lined container before disposing them with other household waste.  Remove gloves and wash your hands immediately after handling these items.  Do not share dishes, glasses, or other household items with the patient  Avoid sharing household items. You should not share dishes, drinking glasses, cups, eating utensils, towels, bedding, or other items with a patient who is confirmed to have, or being evaluated for, COVID-19 infection.  After the person uses these items, you should wash them thoroughly with soap and water.  Wash laundry thoroughly  Immediately remove and wash clothes or bedding that have blood, body fluids, and/or secretions or excretions, such as sweat, saliva, sputum, nasal mucus, vomit, urine, or feces, on them.  Wear gloves when handling laundry from the patient.  Read and follow directions on labels of laundry or clothing items and detergent. In general, wash and dry with the warmest temperatures recommended on the label.  Clean all areas the individual has used often  Clean all touchable surfaces, such as counters, tabletops, doorknobs, bathroom fixtures, toilets, phones, keyboards, tablets, and bedside tables, every day. Also, clean any surfaces that may have blood, body fluids, and/or secretions or excretions on them.  Wear gloves when cleaning surfaces the patient has come in contact with.  Use a diluted bleach solution (e.g., dilute bleach with 1 part bleach and 10 parts water) or a household disinfectant with a label that says EPA-registered for coronaviruses. To make a bleach solution at home, add 1 tablespoon of bleach to 1 quart (4 cups) of water. For a larger supply, add  cup of bleach to 1 gallon (16 cups) of water.  Read labels of  cleaning products and follow recommendations provided on product labels. Labels contain instructions for safe and effective use of the cleaning  product including precautions you should take when applying the product, such as wearing gloves or eye protection and making sure you have good ventilation during use of the product.  Remove gloves and wash hands immediately after cleaning.  Monitor yourself for signs and symptoms of illness Caregivers and household members are considered close contacts, should monitor their health, and will be asked to limit movement outside of the home to the extent possible. Follow the monitoring steps for close contacts listed on the symptom monitoring form.   ? If you have additional questions, contact your local health department or call the epidemiologist on call at [phone] (available 24/7). ? This guidance is subject to change. For the most up-to-date guidance from [facility], please refer to their website: TripMetro.huhttps://www.cdc.gov/coronavirus/2019-ncov/hcp/guidance-prevent-spread.html

## 2020-10-07 NOTE — Progress Notes (Signed)
Pt discharged home.  [name](son) called to explain discharge instructions and all questions answered, son verbalized understanding.

## 2020-10-08 LAB — CULTURE, BLOOD (ROUTINE X 2)
Culture: NO GROWTH
Culture: NO GROWTH
Special Requests: ADEQUATE

## 2020-11-11 ENCOUNTER — Emergency Department (HOSPITAL_COMMUNITY): Payer: Medicare Other

## 2020-11-11 ENCOUNTER — Inpatient Hospital Stay (HOSPITAL_COMMUNITY)
Admission: EM | Admit: 2020-11-11 | Discharge: 2020-11-14 | DRG: 689 | Disposition: A | Discharge status: Hospice | Payer: Medicare Other | Attending: Internal Medicine | Admitting: Internal Medicine

## 2020-11-11 ENCOUNTER — Encounter (HOSPITAL_COMMUNITY): Payer: Self-pay | Admitting: Emergency Medicine

## 2020-11-11 DIAGNOSIS — N390 Urinary tract infection, site not specified: Secondary | ICD-10-CM | POA: Diagnosis present

## 2020-11-11 DIAGNOSIS — Z515 Encounter for palliative care: Secondary | ICD-10-CM

## 2020-11-11 DIAGNOSIS — R338 Other retention of urine: Secondary | ICD-10-CM | POA: Diagnosis present

## 2020-11-11 DIAGNOSIS — R197 Diarrhea, unspecified: Secondary | ICD-10-CM | POA: Diagnosis present

## 2020-11-11 DIAGNOSIS — E872 Acidosis: Secondary | ICD-10-CM | POA: Diagnosis present

## 2020-11-11 DIAGNOSIS — Z8701 Personal history of pneumonia (recurrent): Secondary | ICD-10-CM

## 2020-11-11 DIAGNOSIS — N400 Enlarged prostate without lower urinary tract symptoms: Secondary | ICD-10-CM

## 2020-11-11 DIAGNOSIS — N3090 Cystitis, unspecified without hematuria: Secondary | ICD-10-CM

## 2020-11-11 DIAGNOSIS — E119 Type 2 diabetes mellitus without complications: Secondary | ICD-10-CM

## 2020-11-11 DIAGNOSIS — E871 Hypo-osmolality and hyponatremia: Secondary | ICD-10-CM | POA: Diagnosis present

## 2020-11-11 DIAGNOSIS — F0390 Unspecified dementia without behavioral disturbance: Secondary | ICD-10-CM | POA: Diagnosis present

## 2020-11-11 DIAGNOSIS — Z7984 Long term (current) use of oral hypoglycemic drugs: Secondary | ICD-10-CM

## 2020-11-11 DIAGNOSIS — Z66 Do not resuscitate: Secondary | ICD-10-CM | POA: Diagnosis present

## 2020-11-11 DIAGNOSIS — Z8673 Personal history of transient ischemic attack (TIA), and cerebral infarction without residual deficits: Secondary | ICD-10-CM

## 2020-11-11 DIAGNOSIS — R680 Hypothermia, not associated with low environmental temperature: Secondary | ICD-10-CM | POA: Diagnosis present

## 2020-11-11 DIAGNOSIS — R627 Adult failure to thrive: Secondary | ICD-10-CM | POA: Diagnosis present

## 2020-11-11 DIAGNOSIS — N401 Enlarged prostate with lower urinary tract symptoms: Secondary | ICD-10-CM | POA: Diagnosis present

## 2020-11-11 DIAGNOSIS — G9341 Metabolic encephalopathy: Secondary | ICD-10-CM | POA: Diagnosis present

## 2020-11-11 DIAGNOSIS — Z79899 Other long term (current) drug therapy: Secondary | ICD-10-CM

## 2020-11-11 DIAGNOSIS — R4182 Altered mental status, unspecified: Secondary | ICD-10-CM

## 2020-11-11 DIAGNOSIS — Z8616 Personal history of COVID-19: Secondary | ICD-10-CM

## 2020-11-11 DIAGNOSIS — E860 Dehydration: Secondary | ICD-10-CM | POA: Diagnosis not present

## 2020-11-11 DIAGNOSIS — Z931 Gastrostomy status: Secondary | ICD-10-CM

## 2020-11-11 LAB — URINALYSIS, ROUTINE W REFLEX MICROSCOPIC
Bilirubin Urine: NEGATIVE
Glucose, UA: >=500 mg/dL — AB
Hgb urine dipstick: NEGATIVE
Ketones, ur: NEGATIVE mg/dL
Nitrite: POSITIVE — AB
Protein, ur: NEGATIVE mg/dL
Specific Gravity, Urine: 1.009 (ref 1.005–1.030)
pH: 6 (ref 5.0–8.0)

## 2020-11-11 LAB — CBC WITH DIFFERENTIAL/PLATELET
Abs Immature Granulocytes: 0.02 10*3/uL (ref 0.00–0.07)
Basophils Absolute: 0 10*3/uL (ref 0.0–0.1)
Basophils Relative: 0 %
Eosinophils Absolute: 0 10*3/uL (ref 0.0–0.5)
Eosinophils Relative: 0 %
HCT: 40 % (ref 39.0–52.0)
Hemoglobin: 13.3 g/dL (ref 13.0–17.0)
Immature Granulocytes: 0 %
Lymphocytes Relative: 7 %
Lymphs Abs: 0.5 10*3/uL — ABNORMAL LOW (ref 0.7–4.0)
MCH: 28.9 pg (ref 26.0–34.0)
MCHC: 33.3 g/dL (ref 30.0–36.0)
MCV: 87 fL (ref 80.0–100.0)
Monocytes Absolute: 0.2 10*3/uL (ref 0.1–1.0)
Monocytes Relative: 3 %
Neutro Abs: 6.6 10*3/uL (ref 1.7–7.7)
Neutrophils Relative %: 90 %
Platelets: 144 10*3/uL — ABNORMAL LOW (ref 150–400)
RBC: 4.6 MIL/uL (ref 4.22–5.81)
RDW: 13.2 % (ref 11.5–15.5)
WBC: 7.3 10*3/uL (ref 4.0–10.5)
nRBC: 0 % (ref 0.0–0.2)

## 2020-11-11 LAB — PROTIME-INR
INR: 0.9 (ref 0.8–1.2)
Prothrombin Time: 11.9 seconds (ref 11.4–15.2)

## 2020-11-11 LAB — COMPREHENSIVE METABOLIC PANEL
ALT: 56 U/L — ABNORMAL HIGH (ref 0–44)
AST: 52 U/L — ABNORMAL HIGH (ref 15–41)
Albumin: 3.2 g/dL — ABNORMAL LOW (ref 3.5–5.0)
Alkaline Phosphatase: 76 U/L (ref 38–126)
Anion gap: 10 (ref 5–15)
BUN: 25 mg/dL — ABNORMAL HIGH (ref 8–23)
CO2: 26 mmol/L (ref 22–32)
Calcium: 10.3 mg/dL (ref 8.9–10.3)
Chloride: 93 mmol/L — ABNORMAL LOW (ref 98–111)
Creatinine, Ser: 0.87 mg/dL (ref 0.61–1.24)
GFR, Estimated: >60 mL/min (ref >60)
Glucose, Bld: 263 mg/dL — ABNORMAL HIGH (ref 70–99)
Potassium: 4.5 mmol/L (ref 3.5–5.1)
Sodium: 129 mmol/L — ABNORMAL LOW (ref 135–145)
Total Bilirubin: 0.4 mg/dL (ref 0.3–1.2)
Total Protein: 6.3 g/dL — ABNORMAL LOW (ref 6.5–8.1)

## 2020-11-11 LAB — LACTIC ACID, PLASMA
Lactic Acid, Venous: 2.2 mmol/L (ref 0.5–1.9)
Lactic Acid, Venous: 0.7 mmol/L (ref 0.5–1.9)

## 2020-11-11 MED ORDER — ONDANSETRON HCL 4 MG/2ML IJ SOLN: 4.0000 mg | Freq: Four times a day (QID) | INTRAMUSCULAR | Status: DC | PRN

## 2020-11-11 MED ORDER — ENOXAPARIN SODIUM 40 MG/0.4ML ~~LOC~~ SOLN
40.0000 mg | Freq: Every day | SUBCUTANEOUS | Status: DC
  Administered 2020-11-12: 40 mg via SUBCUTANEOUS
  Filled 2020-11-11: qty 0.4

## 2020-11-11 MED ORDER — OSMOLITE 1.5 CAL PO LIQD
237.0000 mL | Freq: Four times a day (QID) | ORAL | Status: DC
  Administered 2020-11-12: 237 mL
  Filled 2020-11-11 (×3): qty 237

## 2020-11-11 MED ORDER — SODIUM CHLORIDE 0.9 % IV SOLN
1.0000 g | INTRAVENOUS | Status: DC
  Administered 2020-11-12: 1 g via INTRAVENOUS
  Filled 2020-11-11 (×2): qty 10

## 2020-11-11 MED ORDER — ACETAMINOPHEN 325 MG PO TABS: 650.0000 mg | ORAL_TABLET | Freq: Four times a day (QID) | ORAL | Status: DC | PRN

## 2020-11-11 MED ORDER — SODIUM CHLORIDE 0.9 % IV SOLN: 1.0000 g | Freq: Once | INTRAVENOUS | Status: DC

## 2020-11-11 MED ORDER — ONDANSETRON HCL 4 MG PO TABS: 4.0000 mg | ORAL_TABLET | Freq: Four times a day (QID) | ORAL | Status: DC | PRN

## 2020-11-11 MED ORDER — INSULIN ASPART 100 UNIT/ML ~~LOC~~ SOLN: 0.0000 [IU] | Freq: Every day | SUBCUTANEOUS | Status: DC

## 2020-11-11 MED ORDER — INSULIN ASPART 100 UNIT/ML ~~LOC~~ SOLN: 0.0000 [IU] | SUBCUTANEOUS | Status: DC

## 2020-11-11 MED ORDER — SODIUM CHLORIDE 0.9 % IV BOLUS (SEPSIS)
500.0000 mL | Freq: Once | INTRAVENOUS | Status: AC
  Administered 2020-11-11: 500 mL via INTRAVENOUS

## 2020-11-11 MED ORDER — PROSOURCE TF PO LIQD: 45.0000 mL | Freq: Two times a day (BID) | ORAL | Status: DC

## 2020-11-11 MED ORDER — IOHEXOL 300 MG/ML  SOLN
100.0000 mL | Freq: Once | INTRAMUSCULAR | Status: AC | PRN
  Administered 2020-11-11: 100 mL via INTRAVENOUS

## 2020-11-11 MED ORDER — ACETAMINOPHEN 650 MG RE SUPP: 650.0000 mg | Freq: Four times a day (QID) | RECTAL | Status: DC | PRN

## 2020-11-11 MED ORDER — SODIUM CHLORIDE 0.9 % IV BOLUS
500.0000 mL | Freq: Once | INTRAVENOUS | Status: AC
  Administered 2020-11-11: 500 mL via INTRAVENOUS

## 2020-11-11 MED ORDER — ENSURE ENLIVE PO LIQD: 237.0000 mL | Freq: Two times a day (BID) | ORAL | Status: DC

## 2020-11-11 MED ORDER — INSULIN ASPART 100 UNIT/ML ~~LOC~~ SOLN
0.0000 [IU] | Freq: Three times a day (TID) | SUBCUTANEOUS | Status: DC
  Administered 2020-11-12: 5 [IU] via SUBCUTANEOUS

## 2020-11-11 MED ORDER — SODIUM CHLORIDE 0.9 % IV SOLN
1000.0000 mL | INTRAVENOUS | Status: DC
  Administered 2020-11-11: 1000 mL via INTRAVENOUS

## 2020-11-11 NOTE — ED Provider Notes (Signed)
Pt signed out by Dr. Lynelle Doctor pending UA.  UA does show some wbcs in clumps and some + nitrites.  CT shows some possible cystitis, so I will start him on rocephin.  Pt did have some diarrhea yesterday.  BP initially low and LA elevated.  Pt given IVFs and lactic has gone back down.  No fever, but due to hypotension and diarrhea, I ordered a CT abd/pelvis.   CT abd/pelvis negative for anything acute.      IMPRESSION:  1. No acute findings identified within the abdomen or pelvis.  2. 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  3. Moderate distension of the urinary bladder with diffuse  circumferential wall thickening. Findings may reflect sequelae of  chronic bladder outlet obstruction versus cystitis.  4. Improved appearance of bilateral lower lobe airspace and  ground-glass densities compatible with resolving pneumonia.  5. Aortic atherosclerosis.    Aortic Atherosclerosis (ICD10-I70.0).     We tried to get pt up to ambulate, but he was still unable to ambulate.  He seems more confused now that he did earlier.  ? Sundowning.  Per family, he can normally ambulate without problems.  Due to this, I ordered a MRI:    IMPRESSION:  1. No acute intracranial abnormality.  2. 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     Pt is normally able to ambulate, but can't now.  Maybe from his UTI.  Maybe from inadequate intake. BP has improved, but is still low.  Pt d/w Dr. Julian Reil ([facility]) for admission.   Jacalyn Lefevre, MD 11/11/20 [id number]

## 2020-11-11 NOTE — ED Notes (Signed)
Patient transported to CT 

## 2020-11-11 NOTE — ED Provider Notes (Signed)
[facility] EMERGENCY DEPARTMENT Provider Note   CSN: [id number] Arrival date & time: 11/11/20  1305     History Chief Complaint  Patient presents with  . Altered Mental Status    Justin Hawkins is a 80 y.o. male.  HPI   Pt denies any symptoms.  He states he feels fine.  He does not remember having any issues with diarrhea.    According to the EMS report family was concerned about the patient being confused and nonambulatory today.  Patient has history of dementia but per EMS report is normally able to carry on a conversation and ambulate independently.  Yesterday the patient began having several bouts of diarrhea and incontinence.  No known fevers.  Patient denies any trouble with chest pain or abdominal pain.  No headache.  No head injury.   Past Medical History:  Diagnosis Date  . Dementia (HCC)   . Diabetes mellitus without complication (HCC)   . Stroke [facility])     Patient Active Problem List   Diagnosis Date Noted  . Dementia without behavioral disturbance (HCC) 10/07/2020  . Adult failure to thrive 10/07/2020  . COVID-19 virus infection 10/04/2020  . Normocytic normochromic anemia 10/04/2020    Past Surgical History:  Procedure Laterality Date  . IR REPLC GASTRO/COLONIC TUBE PERCUT W/FLUORO  10/08/2018       Family History  Family history unknown: Yes    Social History   Tobacco Use  . Smoking status: Never Smoker  . Smokeless tobacco: Never Used  Substance Use Topics  . Alcohol use: Yes  . Drug use: Never    Home Medications Prior to Admission medications   Medication Sig Start Date End Date Taking? Authorizing Provider  atorvastatin (LIPITOR) 10 MG tablet Take 10 mg by mouth at bedtime. 06/06/20   [provider]  cyproheptadine (PERIACTIN) 4 MG tablet Take 4 mg by mouth 2 (two) times daily.    [provider]  feeding supplement (ENSURE ENLIVE / ENSURE PLUS) LIQD Take 237 mLs by mouth 2 (two) times daily between  meals. 10/07/20 01/05/21  [place], Alvira Philips, DO  metFORMIN (GLUCOPHAGE) 1000 MG tablet Take 1,000 mg by mouth daily. 06/07/20   [provider]  mirtazapine (REMERON) 30 MG tablet Take 30 mg by mouth at bedtime. 09/06/20   [provider]  Nutritional Supplements (FEEDING SUPPLEMENT, OSMOLITE 1.5 CAL,) LIQD Place 237 mLs into feeding tube 4 (four) times daily. 10/07/20 01/05/21  [place], Eric J, DO  Nutritional Supplements (FEEDING SUPPLEMENT, PROSOURCE TF,) liquid Place 45 mLs into feeding tube 2 (two) times daily. 10/07/20 01/05/21  [place], Alvira Philips, DO  polyethylene glycol (MIRALAX / GLYCOLAX) 17 g packet Place 17 g into feeding tube daily. 10/08/20 01/06/21  [place], Eric J, DO  senna-docusate (SENOKOT-S) 8.6-50 MG tablet Take 2 tablets by mouth 2 (two) times daily. 10/07/20 01/05/21  [place], Eric J, DO    Allergies    Patient has no known allergies.  Review of Systems   Review of Systems  All other systems reviewed and are negative.   Physical Exam Updated Vital Signs BP 98/62 (BP Location: Right Arm)   Pulse 75   Temp (!) 97.4 F (36.3 C)   Resp 16   SpO2 100%   Physical Exam Vitals and nursing note reviewed.  Constitutional:      Appearance: He is well-developed and well-nourished.     Comments: Elderly, frail, underweight  HENT:     Head: Normocephalic and atraumatic.  Right Ear: External ear normal.     Left Ear: External ear normal.     Mouth/Throat:     Mouth: Mucous membranes are dry.  Eyes:     General: No scleral icterus.       Right eye: No discharge.        Left eye: No discharge.     Conjunctiva/sclera: Conjunctivae normal.  Neck:     Trachea: No tracheal deviation.  Cardiovascular:     Rate and Rhythm: Normal rate and regular rhythm.     Pulses: Intact distal pulses.  Pulmonary:     Effort: Pulmonary effort is normal. No respiratory distress.     Breath sounds: Normal breath sounds. No stridor. No wheezing or rales.  Abdominal:     General:  Bowel sounds are normal. There is no distension.     Palpations: Abdomen is soft.     Tenderness: There is no abdominal tenderness. There is no guarding or rebound.     Comments: Feeding tube in place, no erythema around the site  Musculoskeletal:        General: No tenderness or edema.     Cervical back: Neck supple.     Right lower leg: No edema.     Left lower leg: No edema.  Skin:    General: Skin is warm and dry.     Findings: No rash.  Neurological:     Cranial Nerves: No cranial nerve deficit (no facial droop, extraocular movements intact, no slurred speech).     Sensory: No sensory deficit.     Motor: Weakness and tremor present. No abnormal muscle tone or seizure activity.     Deep Tendon Reflexes: Strength normal.     Comments: Patient is unaware of the year.  Is unable to tell me why he is in the ED, able to lift both legs and arms off the bed, movements are slow, mild tremor, generalized weakness  Psychiatric:        Mood and Affect: Mood and affect normal.     ED Results / Procedures / Treatments   Labs (all labs ordered are listed, but only abnormal results are displayed) Labs Reviewed  CULTURE, BLOOD (ROUTINE X 2)  CULTURE, BLOOD (ROUTINE X 2)  COMPREHENSIVE METABOLIC PANEL  LACTIC ACID, PLASMA  LACTIC ACID, PLASMA  CBC WITH DIFFERENTIAL/PLATELET  PROTIME-INR  URINALYSIS, ROUTINE W REFLEX MICROSCOPIC    EKG EKG Interpretation  Date/Time:  Thursday November 11 2020 13:19:35 EST Ventricular Rate:  74 PR Interval:  168 QRS Duration: 68 QT Interval:  386 QTC Calculation: 428 R Axis:   -21 Text Interpretation: Normal sinus rhythm Low voltage QRS Nonspecific T wave abnormality Abnormal ECG Artifact Poor data quality in current ECG precludes serial comparison Confirmed by Linwood Dibbles [phone]) on 11/11/2020 3:49:57 PM   Radiology No results found.  Procedures Procedures   Medications Ordered in ED Medications - No data to display  ED Course  I have  reviewed the triage vital signs and the nursing notes.  Pertinent labs & imaging results that were available during my care of the patient were reviewed by me and considered in my medical decision making (see chart for details).  Clinical Course as of 11/12/20 0711  Thu Nov 11, 2020  1548 Initial lactic acid level increased. [JK]  1548 Sodium level decreased at 129, 1 month ago was 131.  I doubt this would account for his acute mental status change [JK]  1549 Head CT without acute  findings [JK]  1550 Chest x-ray without acute findings [JK]    Clinical Course User Index [JK] Linwood Dibbles, MD   MDM Rules/Calculators/A&P                          Pt presented with confusion with recent diarrhea.  Afebrile on arrival.  BP stable.  Pt is listless, overall weak.   HEad CT negative.  No signs of acute process.  Labs with mild dehydration.  UA pending.  Initial lactic acid level slightly elevated.  Fluids ordered.  Care turned over to Dr Particia Nearing at shift change. Final Clinical Impression(s) / ED Diagnoses pending   Linwood Dibbles, MD 11/12/20 [phone]

## 2020-11-11 NOTE — ED Triage Notes (Signed)
Patient BIB [facility] from home for altered mental status that started when he woke up this morning. EMS reports that family stated the patient is normally alert, orietnedx4, is continent and can ambulated independently but yesterday started becoming weak and having multiple bouts of diarrhea and incontinence. Patient is oriented to self only, denies pain, is in no apparent distress.

## 2020-11-12 DIAGNOSIS — F0390 Unspecified dementia without behavioral disturbance: Secondary | ICD-10-CM | POA: Diagnosis present

## 2020-11-12 DIAGNOSIS — Z7984 Long term (current) use of oral hypoglycemic drugs: Secondary | ICD-10-CM | POA: Diagnosis not present

## 2020-11-12 DIAGNOSIS — Z8701 Personal history of pneumonia (recurrent): Secondary | ICD-10-CM | POA: Diagnosis not present

## 2020-11-12 DIAGNOSIS — Z515 Encounter for palliative care: Secondary | ICD-10-CM | POA: Diagnosis not present

## 2020-11-12 DIAGNOSIS — G9341 Metabolic encephalopathy: Secondary | ICD-10-CM | POA: Diagnosis present

## 2020-11-12 DIAGNOSIS — Z8616 Personal history of COVID-19: Secondary | ICD-10-CM | POA: Diagnosis not present

## 2020-11-12 DIAGNOSIS — R4182 Altered mental status, unspecified: Secondary | ICD-10-CM | POA: Diagnosis not present

## 2020-11-12 DIAGNOSIS — Z789 Other specified health status: Secondary | ICD-10-CM

## 2020-11-12 DIAGNOSIS — N390 Urinary tract infection, site not specified: Principal | ICD-10-CM

## 2020-11-12 DIAGNOSIS — R627 Adult failure to thrive: Secondary | ICD-10-CM

## 2020-11-12 DIAGNOSIS — Z66 Do not resuscitate: Secondary | ICD-10-CM | POA: Diagnosis present

## 2020-11-12 DIAGNOSIS — E119 Type 2 diabetes mellitus without complications: Secondary | ICD-10-CM

## 2020-11-12 DIAGNOSIS — Z79899 Other long term (current) drug therapy: Secondary | ICD-10-CM | POA: Diagnosis not present

## 2020-11-12 DIAGNOSIS — E860 Dehydration: Secondary | ICD-10-CM

## 2020-11-12 DIAGNOSIS — E872 Acidosis: Secondary | ICD-10-CM | POA: Diagnosis present

## 2020-11-12 DIAGNOSIS — E871 Hypo-osmolality and hyponatremia: Secondary | ICD-10-CM | POA: Diagnosis present

## 2020-11-12 DIAGNOSIS — N400 Enlarged prostate without lower urinary tract symptoms: Secondary | ICD-10-CM

## 2020-11-12 DIAGNOSIS — R338 Other retention of urine: Secondary | ICD-10-CM | POA: Diagnosis present

## 2020-11-12 DIAGNOSIS — R680 Hypothermia, not associated with low environmental temperature: Secondary | ICD-10-CM | POA: Diagnosis present

## 2020-11-12 DIAGNOSIS — Z8673 Personal history of transient ischemic attack (TIA), and cerebral infarction without residual deficits: Secondary | ICD-10-CM | POA: Diagnosis not present

## 2020-11-12 DIAGNOSIS — Z7189 Other specified counseling: Secondary | ICD-10-CM

## 2020-11-12 DIAGNOSIS — N401 Enlarged prostate with lower urinary tract symptoms: Secondary | ICD-10-CM | POA: Diagnosis present

## 2020-11-12 DIAGNOSIS — Z931 Gastrostomy status: Secondary | ICD-10-CM | POA: Diagnosis not present

## 2020-11-12 DIAGNOSIS — R197 Diarrhea, unspecified: Secondary | ICD-10-CM | POA: Diagnosis present

## 2020-11-12 LAB — BASIC METABOLIC PANEL
Anion gap: 8 (ref 5–15)
BUN: 17 mg/dL (ref 8–23)
CO2: 24 mmol/L (ref 22–32)
Calcium: 9.6 mg/dL (ref 8.9–10.3)
Chloride: 99 mmol/L (ref 98–111)
Creatinine, Ser: 0.67 mg/dL (ref 0.61–1.24)
GFR, Estimated: >60 mL/min (ref >60)
Glucose, Bld: 115 mg/dL — ABNORMAL HIGH (ref 70–99)
Potassium: 4.1 mmol/L (ref 3.5–5.1)
Sodium: 131 mmol/L — ABNORMAL LOW (ref 135–145)

## 2020-11-12 LAB — CBC
HCT: 32.6 % — ABNORMAL LOW (ref 39.0–52.0)
Hemoglobin: 11.3 g/dL — ABNORMAL LOW (ref 13.0–17.0)
MCH: 29.6 pg (ref 26.0–34.0)
MCHC: 34.7 g/dL (ref 30.0–36.0)
MCV: 85.3 fL (ref 80.0–100.0)
Platelets: 183 10*3/uL (ref 150–400)
RBC: 3.82 MIL/uL — ABNORMAL LOW (ref 4.22–5.81)
RDW: 13.3 % (ref 11.5–15.5)
WBC: 5.4 10*3/uL (ref 4.0–10.5)
nRBC: 0 % (ref 0.0–0.2)

## 2020-11-12 LAB — CBG MONITORING, ED
Glucose-Capillary: 110 mg/dL — ABNORMAL HIGH (ref 70–99)
Glucose-Capillary: 108 mg/dL — ABNORMAL HIGH (ref 70–99)
Glucose-Capillary: 236 mg/dL — ABNORMAL HIGH (ref 70–99)

## 2020-11-12 LAB — TSH: TSH: 3.174 u[IU]/mL (ref 0.350–4.500)

## 2020-11-12 LAB — PSA: Prostatic Specific Antigen: 11.55 ng/mL — ABNORMAL HIGH (ref 0.00–4.00)

## 2020-11-12 MED ORDER — LORAZEPAM 2 MG/ML PO CONC: 1.0000 mg | ORAL | Status: DC | PRN

## 2020-11-12 MED ORDER — GLYCOPYRROLATE 1 MG PO TABS: 1.0000 mg | ORAL_TABLET | ORAL | Status: DC | PRN

## 2020-11-12 MED ORDER — BIOTENE DRY MOUTH MT LIQD
15.0000 mL | Freq: Two times a day (BID) | OROMUCOSAL | Status: DC
  Administered 2020-11-12 – 2020-11-14 (×4): 15 mL via TOPICAL

## 2020-11-12 MED ORDER — HALOPERIDOL LACTATE 2 MG/ML PO CONC
2.0000 mg | Freq: Four times a day (QID) | ORAL | Status: DC | PRN
  Filled 2020-11-12: qty 1

## 2020-11-12 MED ORDER — TAMSULOSIN HCL 0.4 MG PO CAPS
0.4000 mg | ORAL_CAPSULE | Freq: Every day | ORAL | Status: DC
  Administered 2020-11-12: 0.4 mg via ORAL
  Filled 2020-11-12: qty 1

## 2020-11-12 MED ORDER — SODIUM CHLORIDE 0.9 % IV SOLN: INTRAVENOUS | Status: DC

## 2020-11-12 MED ORDER — LORAZEPAM 2 MG/ML IJ SOLN: 1.0000 mg | INTRAMUSCULAR | Status: DC | PRN

## 2020-11-12 MED ORDER — GLYCOPYRROLATE 0.2 MG/ML IJ SOLN
0.2000 mg | INTRAMUSCULAR | Status: DC | PRN
  Administered 2020-11-12: 0.2 mg via INTRAVENOUS
  Filled 2020-11-12: qty 1

## 2020-11-12 MED ORDER — LORAZEPAM 1 MG PO TABS: 1.0000 mg | ORAL_TABLET | ORAL | Status: DC | PRN

## 2020-11-12 MED ORDER — HALOPERIDOL LACTATE 5 MG/ML IJ SOLN
2.0000 mg | Freq: Four times a day (QID) | INTRAMUSCULAR | Status: DC | PRN
Start: 2020-11-12 — End: 2020-11-14

## 2020-11-12 MED ORDER — POLYVINYL ALCOHOL 1.4 % OP SOLN
1.0000 [drp] | Freq: Four times a day (QID) | OPHTHALMIC | Status: DC | PRN
  Filled 2020-11-12: qty 15

## 2020-11-12 MED ORDER — HALOPERIDOL 1 MG PO TABS: 2.0000 mg | ORAL_TABLET | Freq: Four times a day (QID) | ORAL | Status: DC | PRN

## 2020-11-12 MED ORDER — GLYCOPYRROLATE 0.2 MG/ML IJ SOLN: 0.2000 mg | INTRAMUSCULAR | Status: DC | PRN

## 2020-11-12 MED ORDER — MIRTAZAPINE 15 MG PO TABS
30.0000 mg | ORAL_TABLET | Freq: Every day | ORAL | Status: DC
  Administered 2020-11-12: 30 mg via ORAL
  Filled 2020-11-12: qty 1
  Filled 2020-11-12: qty 2

## 2020-11-12 MED ORDER — MORPHINE SULFATE (PF) 2 MG/ML IV SOLN: 2.0000 mg | INTRAVENOUS | Status: DC | PRN

## 2020-11-12 NOTE — ED Notes (Signed)
Breakfast Ordered 

## 2020-11-12 NOTE — H&P (Signed)
History and Physical    Jerene Dillingrthur L Greear ZOX:[id number] DOB: June 03, 1941 DOA: 11/11/2020  [facility]  Patient coming from: Home  I have personally briefly reviewed [facility]  Chief Complaint: AMS  HPI: Jerene Dillingrthur L Breuer is a 80 y.o. male with medical history significant of advanced dementia, adult failure to thrive, DM2, stroke.  At baseline pt normally able to carry on a conversation and ambulate independently.  Does have PEG tube in place for adult failure to thrive.  Does eat food PO.  As of last month family was only using PEG tube when he wouldn't take PO food, though while in hospital pt was noted to be "pocketing" food and having poor overall PO intake so he was put on scheduled PEG feeds to supplement.  Pt had been admitted last month on 1/9-1/13 for COVID-19.  Also had constipation, hyponatremia, and adult failure to thrive as noted.  Yesterday pt began having several bouts of diarrhea and incontinence.  Today pt more confused and non-ambulatory, prompting family to send pt in to ED.  No known fevers, no headache, no chest nor abd pain.  History somewhat limited due to pt dementia.  ED Course: sodium 129, normal WBC.  UA with small LE, positive nitrites.  Initial lactate 2.2, 0.7 on repeat after 1L IVF  MRI brain = nothing acute  CT a/p = marked prostate enlargement, mod distention of urinary bladder, diffuse wall thickening = chronic bladder outlet obstruction vs cystitis.  Prior COVID PNA is improved / resolving   Review of Systems: As per HPI, otherwise all review of systems negative.  Past Medical History:  Diagnosis Date  . Dementia (HCC)   . Diabetes mellitus without complication (HCC)   . Stroke [facility](HCC)     Past Surgical History:  Procedure Laterality Date  . IR REPLC GASTRO/COLONIC TUBE PERCUT W/FLUORO  10/08/2018     reports that he has never smoked. He has never used smokeless tobacco. He reports  current alcohol use. He reports that he does not use drugs.  No Known Allergies  Family History  Family history unknown: Yes     Prior to Admission medications   Medication Sig Start Date End Date Taking? Authorizing Provider  atorvastatin (LIPITOR) 10 MG tablet Take 10 mg by mouth at bedtime. 06/06/20   [provider]  cyproheptadine (PERIACTIN) 4 MG tablet Take 4 mg by mouth 2 (two) times daily.    [provider]  feeding supplement (ENSURE ENLIVE / ENSURE PLUS) LIQD Take 237 mLs by mouth 2 (two) times daily between meals. 10/07/20 01/05/21  [place], Alvira PhilipsEric J, DO  metFORMIN (GLUCOPHAGE) 1000 MG tablet Take 1,000 mg by mouth daily. 06/07/20   [provider]  mirtazapine (REMERON) 30 MG tablet Take 30 mg by mouth at bedtime. 09/06/20   [provider]  Nutritional Supplements (FEEDING SUPPLEMENT, OSMOLITE 1.5 CAL,) LIQD Place 237 mLs into feeding tube 4 (four) times daily. 10/07/20 01/05/21  [place], Eric J, DO  Nutritional Supplements (FEEDING SUPPLEMENT, PROSOURCE TF,) liquid Place 45 mLs into feeding tube 2 (two) times daily. 10/07/20 01/05/21  [place], Alvira PhilipsEric J, DO  polyethylene glycol (MIRALAX / GLYCOLAX) 17 g packet Place 17 g into feeding tube daily. 10/08/20 01/06/21  [place], Eric J, DO  senna-docusate (SENOKOT-S) 8.6-50 MG tablet Take 2 tablets by mouth 2 (two) times daily. 10/07/20 01/05/21  [place], Eric J, DO    Physical Exam: Vitals:   11/11/20 2045 11/11/20 2115  11/11/20 2200 11/11/20 2215  BP: 121/71 104/69 108/68 109/72  Pulse: 61 60 (!) 59 (!) 59  Resp: (!) 21 13 17 14   Temp:      TempSrc:      SpO2: 100% 98% 100% 100%    Constitutional: Elderly, frail, underweight. Eyes: PERRL, lids and conjunctivae normal ENMT: Mucous membranes are moist. Posterior pharynx clear of any exudate or lesions.Normal dentition.  Neck: normal, supple, no masses, no thyromegaly Respiratory: clear to auscultation bilaterally, no wheezing, no crackles. Normal  respiratory effort. No accessory muscle use.  Cardiovascular: Regular rate and rhythm, no murmurs / rubs / gallops. No extremity edema. 2+ pedal pulses. No carotid bruits.  Abdomen: no tenderness, no masses palpated. No hepatosplenomegaly. Bowel sounds positive.  Musculoskeletal: no clubbing / cyanosis. No joint deformity upper and lower extremities. Good ROM, no contractures. Normal muscle tone.  Skin: no rashes, lesions, ulcers. No induration Neurologic: Generalized weakness and tremor. Psychiatric: Demented, confused, follows commands.   Labs on Admission: I have personally reviewed following labs and imaging studies  CBC: Recent Labs  Lab 11/11/20 1332  WBC 7.3  NEUTROABS 6.6  HGB 13.3  HCT 40.0  MCV 87.0  PLT 144*   Basic Metabolic Panel: Recent Labs  Lab 11/11/20 1332  NA 129*  K 4.5  CL 93*  CO2 26  GLUCOSE 263*  BUN 25*  CREATININE 0.87  CALCIUM 10.3   GFR: CrCl cannot be calculated (Unknown ideal weight.). Liver Function Tests: Recent Labs  Lab 11/11/20 1332  AST 52*  ALT 56*  ALKPHOS 76  BILITOT 0.4  PROT 6.3*  ALBUMIN 3.2*   No results for input(s): LIPASE, AMYLASE in the last 168 hours. No results for input(s): AMMONIA in the last 168 hours. Coagulation Profile: Recent Labs  Lab 11/11/20 1332  INR 0.9   Cardiac Enzymes: No results for input(s): CKTOTAL, CKMB, CKMBINDEX, TROPONINI in the last 168 hours. BNP (last 3 results) No results for input(s): PROBNP in the last 8760 hours. HbA1C: No results for input(s): HGBA1C in the last 72 hours. CBG: No results for input(s): GLUCAP in the last 168 hours. Lipid Profile: No results for input(s): CHOL, HDL, LDLCALC, TRIG, CHOLHDL, LDLDIRECT in the last 72 hours. Thyroid Function Tests: No results for input(s): TSH, T4TOTAL, FREET4, T3FREE, THYROIDAB in the last 72 hours. Anemia Panel: No results for input(s): VITAMINB12, FOLATE, FERRITIN, TIBC, IRON, RETICCTPCT in the last 72 hours. Urine  analysis:    Component Value Date/Time   COLORURINE YELLOW 11/11/2020 1811   APPEARANCEUR CLEAR 11/11/2020 1811   LABSPEC 1.009 11/11/2020 1811   PHURINE 6.0 11/11/2020 1811   GLUCOSEU >=500 (A) 11/11/2020 1811   HGBUR NEGATIVE 11/11/2020 1811   BILIRUBINUR NEGATIVE 11/11/2020 1811   KETONESUR NEGATIVE 11/11/2020 1811   PROTEINUR NEGATIVE 11/11/2020 1811   NITRITE POSITIVE (A) 11/11/2020 1811   LEUKOCYTESUR SMALL (A) 11/11/2020 1811    Radiological Exams on Admission: DG Chest 2 View  Result Date: 11/11/2020 CLINICAL DATA:  Possible sepsis. EXAM: CHEST - 2 VIEW COMPARISON:  Chest x-ray dated October 03, 2020. FINDINGS: The heart size and mediastinal contours are within normal limits. Both lungs are clear. The visualized skeletal structures are unremarkable. IMPRESSION: No active cardiopulmonary disease. Electronically Signed   By: October 05, 2020 M.D.   On: 11/11/2020 14:10   CT Head Wo Contrast  Result Date: 11/11/2020 CLINICAL DATA:  Altered mental status EXAM: CT HEAD WITHOUT CONTRAST TECHNIQUE: Contiguous axial images were obtained from the base of the skull  through the vertex without intravenous contrast. COMPARISON:  None. FINDINGS: Brain: There is mild to moderate diffuse atrophy. There is no intracranial mass, hemorrhage, extra-axial fluid collection, or midline shift. There is mild decreased attenuation in the centra semiovale bilaterally. No acute infarct is demonstrable. Vascular: There is no hyperdense vessel. There is calcification in each carotid siphon region. Skull: Bony calvarium appears intact. Sinuses/Orbits: Mucosal thickening noted in several ethmoid air cells. Apparent retention cyst in the right sphenoid sinus. Visualized orbits appear symmetric bilaterally. Other: Mastoid air cells are clear. There is debris in the right external auditory canal. IMPRESSION: Atrophy with mild patchy periventricular small vessel disease. No acute appearing infarct is evident. No mass or  hemorrhage. Foci of arterial vascular calcification noted. There are foci of paranasal sinus disease. There is probable cerumen in the right external auditory canal. Electronically Signed   By: Bretta Bang III M.D.   On: 11/11/2020 15:31   MR BRAIN WO CONTRAST  Result Date: 11/11/2020 CLINICAL DATA:  Encephalopathy EXAM: MRI HEAD WITHOUT CONTRAST TECHNIQUE: Multiplanar, multiecho pulse sequences of the brain and surrounding structures were obtained without intravenous contrast. COMPARISON:  None. FINDINGS: Brain: No acute infarct, mass effect or extra-axial collection. No acute or chronic hemorrhage. There is multifocal hyperintense T2-weighted signal within the white matter. Advanced atrophy for age. There are multiple old right cerebellar infarcts. The midline structures are normal. Vascular: Major flow voids are preserved. Skull and upper cervical spine: Normal calvarium and skull base. Visualized upper cervical spine and soft tissues are normal. Sinuses/Orbits:No paranasal sinus fluid levels or advanced mucosal thickening. No mastoid or middle ear effusion. Normal orbits. IMPRESSION: 1. No acute intracranial abnormality. 2. 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 Electronically Signed   By: Deatra Robinson M.D.   On: 11/11/2020 23:00   CT ABDOMEN PELVIS W CONTRAST  Result Date: 11/11/2020 CLINICAL DATA:  Diarrhea. EXAM: CT ABDOMEN AND PELVIS WITH CONTRAST TECHNIQUE: Multidetector CT imaging of the abdomen and pelvis was performed using the standard protocol following bolus administration of intravenous contrast. CONTRAST:  OMNIPAQUE IOHEXOL 300 MG/ML  SOLN COMPARISON:  10/04/2020 FINDINGS: Lower chest: Mild patchy airspace and ground-glass densities are noted within both lung bases. This is improved when compared with 10/04/2020 compatible with resolving pneumonia. Hepatobiliary: The gallbladder appears within normal limits. No bile duct  dilatation. No focal liver abnormality. Pancreas: Unremarkable. No pancreatic ductal dilatation or surrounding inflammatory changes. Spleen: Normal in size without focal abnormality. Adrenals/Urinary Tract: No focal adrenal mass. Small left kidney cysts are similar to the previous exam. No signs hydronephrosis. There is moderate distension of the urinary bladder with diffuse circumferential wall thickening. Marked enlargement of the prostate gland is identified which has mass effect upon the bladder base. Stomach/Bowel: Percutaneous gastrostomy tube is again noted. No abnormal bowel distension. The appendix is not confidently identified separate from the right lower quadrant bowel loops. No significant bowel wall thickening or inflammation. Vascular/Lymphatic: Aortic atherosclerosis. No aneurysm. No abdominopelvic adenopathy identified. Reproductive: Prostate gland measures 5.6 by 5.1 by 6.0 cm (volume = 90 cm^3). Other: No significant free fluid.  No fluid collection identified. Musculoskeletal: Lumbar degenerative disc disease. No acute or suspicious osseous findings. IMPRESSION: 1. No acute findings identified within the abdomen or pelvis. 2. Marked enlargement of the prostate gland with mass effect upon the bladder base. 3. Moderate distension of the urinary bladder with diffuse circumferential wall thickening. Findings may reflect sequelae of chronic bladder outlet obstruction versus  cystitis. 4. Improved appearance of bilateral lower lobe airspace and ground-glass densities compatible with resolving pneumonia. 5. Aortic atherosclerosis. Aortic Atherosclerosis (ICD10-I70.0). Electronically Signed   By: Signa Kell M.D.   On: 11/11/2020 20:13    EKG: Independently reviewed.  Assessment/Plan Principal Problem:   Acute metabolic encephalopathy Active Problems:   Dementia without behavioral disturbance (HCC)   Adult failure to thrive   Acute lower UTI   Enlarged prostate   Acute urinary retention    DM2 (diabetes mellitus, type 2) (HCC)    1. Acute metabolic encephalopathy - 1. Delirium from dehydration from diarrhea / UTI. 2. UTI - 1. Rocephin 2. Culture pending 3. Diarrhea, dehydration / hyponatremia - 1. IVF: 1L bolus in ED then 125 cc/hr 2. Repeat BMP in AM 3. GI pathogen PNL 4. Hold all laxatives 4. Enlarged prostate and urinary retention - 1. Foley due to urinary retention and UTI 2. PSA 3. Not clear how much more work up / invasive work up would be advisable in this patient with very poor long term prognosis due to advanced dementia, adult failure to thrive, etc. 5. DM2 - 1. Mod scale SSI AC/HS 6. Adult failure to thrive - 1. Cont feeding supplements from last admit 2. See GOC discussion: pal care consult last admit 1/10  DVT prophylaxis: Lovenox Code Status: Full Family Communication: No family in room Disposition Plan: Home after mental status improved Consults called: None Admission status: Place in 52   Charle Clear M. DO [facility]  How to contact the [facility] Attending or Consulting provider 7A - 7P or covering provider during after hours 7P -7A, for this patient?  1. Check the care team in [facility] and look for a) attending/consulting [facility] provider listed and b) the [facility] team listed 2. Log into [email or web address]  Amion Physician Scheduling and messaging for groups and whole hospitals  On call and physician scheduling software for group practices, residents, hospitalists and other medical providers for call, clinic, rotation and shift schedules. On[employer] is a hospital-wide system for scheduling doctors and paging doctors on call. EasyPlot is for scientific plotting and data analysis.  [email or web address]  and use [place]'s universal password to access. If you do not have the password, please contact the hospital operator.  3. Locate the [facility] provider you are looking for under [facility] and page to a number that you can be directly reached. 4. If  you still have difficulty reaching the provider, please page the [facility] (Director on Call) for the Hospitalists listed on amion for assistance.  11/12/2020, 12:18 AM

## 2020-11-12 NOTE — Consult Note (Addendum)
Consultation Note Date: 11/12/2020   Patient Name: Justin [facility]  DOB: [birth year]  MRN: [id number]  Age / Sex: 80 y.o., male  PCP: Justin [facility], Justin Hawkins Referring Physician: Ollen Bowl, MD  Reason for Consultation: Establishing goals of care  HPI/Patient Profile: 80 y.o. 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 He was admitted on 11/11/2020 with acute metabolic encephalopathy, +UTI, dehydration, enlarged prostate with urinary retention, FTT.   ED course: Sodium 129, normal WBC. UA with small LE, positive nitrites. Initial lactate 2.2, 0.7 on repeat after 1L IVF. 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  Of note, he had a recent hospitalization from 1/9-1/13/22 for COVID19 infection. PMT saw patient during this hospitalization and at that time family opted for full code/full scope care.    Clinical Assessment and Goals of Care: I have reviewed Hawkins records including EPIC notes, labs, and imaging. Received report from primary RN - no acute concerns. RN reports patient is refusing all food/liquid, has not had diarrhea since roomed in ED, and has stated multiple times "I want to go home."    Went to visit patient at bedside - no family/visitors present. Patient was lying in bed sleepy but awake, disoriented, and minimally able to participate in conversation - his words are slurred and hard to understand. Signs and non-verbal gestures of pain and discomfort noted. Patient states he has pain in his legs. No respiratory distress or increased work of breathing; however, secretions are noted. Patient states,  "I want to go home" and "What is the purpose of all this" during my visit. Patient is very frail and cachectic in appearance.  Called son/Justin to discuss diagnosis, prognosis, GOC, EOL wishes, disposition, and options. He added his wife/Justin Hawkins to the call.  I introduced Palliative Medicine as specialized Hawkins care for people living with serious illness. It focuses on providing relief from the symptoms and stress of a serious illness. The goal is to improve quality of life for both the patient and the family.  Justin Hawkins remembers speaking with me during patient's last hospitalization.  We discussed a brief life review of the patient as well as functional and nutritional status. The patient is originally from [place] [facility] [place] [facility] but lived most of his life in [place]. The patient is widowed - they had 3 daughters and 1 son. When the patient's heath started to decline 4 years ago, Justin Hawkins moved the patient to [place] to live with him and his wife. Justin Hawkins states the patient's daughters/his sisters are not close with the patient and are not very involved in his care. The patient still lives in a private residence with his Justin Hawkins and daughter-in-law/Justin Hawkins - they are his primary caregivers. Prior to the patient's hospitalization for COVID in January 2022,  he was able to walk with assistance and dress himself, but did need help bathing. However, after that hospitalization, Justin FavreClarence notes a continued decline. The patient no longer can dress himself, he cannot walk, is not interested in eating or drinking, and has become incontinent of bowel and bladder. Justin Eevelyn tells me the patient is now requiring all feeding/hydration through his PEG tube, where previously patient was accepting oral intake with PEG supplementing nutrition.  We discussed patient's current illness and what it means in the larger context of patient's on-going co-morbidities. Natural disease trajectory of dementia and expectations at EOL were discussed.  Reviewed with family that dementia is a progressive, non-curable disease underlying the patient's current acute Hawkins conditions as well as provided education on advanced dementia symptoms. I expressed concern that the patient's dementia is, unfortunately, progressing and gently explained the patient's time is likely becoming limited. Discussed the patient's frality as well as increased risk for decline/rehospitalization once discharged. Informed family that patient is telling staff he "wants to go home." I attempted to elicit values and goals of care important to the patient. The difference between aggressive Hawkins intervention and comfort care was considered in light of the patient's goals of care. Family state that they would not want the patient to pass away in the hospital and would prefer to bring him home. Family express they do not want him in pain. Introduced home hospice services - reviewed hospice philosophy. Family requested time to call and speak with patient's sisters before making final decisions.   All questions and concerns addressed. Encouraged to call with questions and/or concerns. PMT number provided.  4:03 PM Received notification that Justin FavreClarence had called PMT. Returned his call and continued GOC with Justin Favrelarence and Justin EeEvelyn. Justin FavreClarence states family are agreeable for patient to return home with hospice services. We discussed disposition timing per their request - family state they have no DME needs. I explained if there are no DME needs patient could be discharged as early as today; however, explained hospice may not be able to arrive to home to admit patient until tomorrow, I would have to check with them. Family stated that discharge tomorrow was optimal so they could rearrange some items in the home for him. We talked about transition to comfort measures in house and what that would entail inclusive of medications to control pain, dyspnea, agitation, nausea, itching, and hiccups. We  discussed stopping all unnecessary measures such as blood draws, needle sticks, oxygen, antibiotics, CBGs/insulin, cardiac monitoring, and frequent vital signs. Family were agreeable to initiate full comfort measures today - their goals are very clear they do not want the patient in pain or suffering. I discussed stopping tube feeds - education provided that this can cause discomfort at EOL and can increase risk of aspiration. Recommendation given to stop artificial nutrition/hydration - family were agreeable. Prognostication was reviewed per family request - patient likely has days to a week.   Reviewed current [facility] EOL visitation policy - family expressed understanding.  Discussed with family the importance of continued conversation with each other and the Hawkins providers regarding overall plan of care and treatment options, ensuring decisions are within the context of the patient's values and GOCs.    Questions and concerns were addressed. The patient/family was encouraged to call with questions and/or concerns. PMT number previously provided.   Primary Decision Maker: NEXT OF KIN Justin Hawkins/son - states he has HCPOA paperwork, need to obtain copy    SUMMARY OF RECOMMENDATIONS: Initiated full comfort  measures - likely has less than 1 week Initiated DNR/DNI - durable DNR form completed - original placed in shadow chart and copy will be scanned into ACP tab/Vynca Family's goal is for patient to discharge home with hospice, requesting [facility] - TOC and ACC liaison notified and TOC consult placed Family is agreeable for discharge home tomorrow 2/19 Family agreeable to discontinue all artificial nutrition and hydration  Added orders for EOL symptom management and to reflect full comfort measures, as well as discontinued orders that were not focused on comfort Unrestricted visitation orders were placed per current [place] COVID19 EOL visitation policy  Provide frequent  assessments and administer PRN medications as clinically necessary to ensure EOL comfort PMT will continue to follow and support holistically  **ADDENDUM** Received return call from family - they are now requesting patient be transferred to [facility] instead of returning home with hospice. Spoke with Pension scheme manager via after hours line - stated referral/paperwork would have to be started with LCSW.   Recommendations/Plan:  Transfer to [facility] when bed becomes available - TOC and ACC liaison notified of changes, TOC consult updated  Code Status/Advance Care Planning:  DNR  Palliative Prophylaxis:   Aspiration, Bowel Regimen, Delirium Protocol, Frequent Pain Assessment, Oral Care and Turn Reposition  Additional Recommendations (Limitations, Scope, Preferences):  Full Comfort Care  Psycho-social/Spiritual:   Desire for further Chaplaincy support:no Created space and opportunity for patient and family to express thoughts and feelings regarding patient's current Hawkins situation.   Emotional support and therapeutic listening provided.   Prognosis:   Less than one week  Discharge Planning: Home with Hospice      Primary Diagnoses: Present on Admission: . Dementia without behavioral disturbance (HCC) . Acute lower UTI . Adult failure to thrive . Acute metabolic encephalopathy . Enlarged prostate . Acute urinary retention   I have reviewed the Hawkins record, interviewed the patient and family, and examined the patient. The following aspects are pertinent.  Past Hawkins History:  Diagnosis Date  . Dementia (HCC)   . Diabetes mellitus without complication (HCC)   . Stroke [facility])    Social History   Socioeconomic History  . Marital status: Widowed    Spouse name: Not on file  . Number of children: Not on file  . Years of education: Not on file  . Highest education level: Not on file  Occupational History  . Not on file  Tobacco Use  . Smoking status:  Never Smoker  . Smokeless tobacco: Never Used  Substance and Sexual Activity  . Alcohol use: Yes  . Drug use: Never  . Sexual activity: Not on file  Other Topics Concern  . Not on file  Social History Narrative  . Not on file   Social Determinants of Health   Financial Resource Strain: Not on file  Food Insecurity: Not on file  Transportation Needs: Not on file  Physical Activity: Not on file  Stress: Not on file  Social Connections: Not on file   Family History  Family history unknown: Yes   Scheduled Meds: . enoxaparin (LOVENOX) injection  40 mg Subcutaneous QHS  . feeding supplement (OSMOLITE 1.5 CAL)  237 mL Per Tube QID  . insulin aspart  0-15 Units Subcutaneous TID WC  . insulin aspart  0-5 Units Subcutaneous QHS  . mirtazapine  30 mg Oral QHS  . tamsulosin  0.4 mg Oral Daily   Continuous Infusions: . sodium chloride Stopped (11/12/20 0824)  . sodium chloride 125 mL/hr  at 11/12/20 0825  . cefTRIAXone (ROCEPHIN)  IV Stopped (11/12/20 0142)   PRN Meds:.acetaminophen **OR** acetaminophen, ondansetron **OR** ondansetron (ZOFRAN) IV Medications Prior to Admission:  Prior to Admission medications   Medication Sig Start Date End Date Taking? Authorizing Provider  atorvastatin (LIPITOR) 10 MG tablet Take 10 mg by mouth at bedtime. 06/06/20  Yes [provider]  cyproheptadine (PERIACTIN) 4 MG tablet Take 4 mg by mouth 2 (two) times daily.   Yes [provider]  metFORMIN (GLUCOPHAGE) 1000 MG tablet Take 1,000 mg by mouth daily. 06/07/20  Yes [provider]  mirtazapine (REMERON) 30 MG tablet Take 30 mg by mouth at bedtime. 09/06/20  Yes [provider]  Nutritional Supplements (FEEDING SUPPLEMENT, OSMOLITE 1.5 CAL,) LIQD Place 237 mLs into feeding tube 4 (four) times daily. 10/07/20 01/05/21 Yes [place], Eric J, DO  polyethylene glycol (MIRALAX / GLYCOLAX) 17 g packet Place 17 g into feeding tube daily. 10/08/20 01/06/21 Yes [place], Eric J,  DO  pravastatin (PRAVACHOL) 20 MG tablet Take 20 mg by mouth at bedtime.   Yes [provider]  senna-docusate (SENOKOT-S) 8.6-50 MG tablet Take 2 tablets by mouth 2 (two) times daily. 10/07/20 01/05/21 Yes [place], Eric J, DO   No Known Allergies Review of Systems  Unable to perform ROS: Dementia    Physical Exam Vitals and nursing note reviewed.  Constitutional:      General: He is not in acute distress.    Appearance: He is cachectic. He is ill-appearing.  Pulmonary:     Effort: No respiratory distress.  Skin:    General: Skin is warm and dry.  Neurological:     Mental Status: He is alert. He is disoriented and confused.     Motor: Weakness present.  Psychiatric:        Speech: Speech is slurred.        Behavior: Behavior is cooperative.        Cognition and Memory: Cognition is impaired. Memory is impaired.     Vital Signs: BP (!) 104/53   Pulse (!) 59   Temp 98.6 F (37 C) (Oral)   Resp 16   SpO2 98%  Pain Scale: Faces   Pain Score: 0-No pain   SpO2: SpO2: 98 % O2 Device:SpO2: 98 % O2 Flow Rate: .   IO: Intake/output summary:   Intake/Output Summary (Last 24 hours) at 11/12/2020 1417 Last data filed at 11/12/2020 0824 Gross per 24 hour  Intake 791.99 ml  Output 1000 ml  Net -208.01 ml    LBM:   Baseline Weight:   Most recent weight:       Palliative Assessment/Data: PPS 10%      Time In/Out: 1445-1530, 2229-7989, 2119-4174 Time Total: 100 minutes  Greater than 50%  of this time was spent counseling and coordinating care related to the above assessment and plan.  Signed by: Haskel Khan, NP   Please contact Palliative Medicine Team phone at [phone] for questions and concerns.  For individual provider: See Loretha Stapler

## 2020-11-12 NOTE — Progress Notes (Signed)
PROGRESS NOTE    Justin Hawkins  HKV:[id number] DOB: May 28, 1941 DOA: 11/11/2020 PCP: [facility]   Brief Narrative:  Justin Hawkins is a 80 y.o. male with medical history significant of advanced dementia, adult failure to thrive, DM2, stroke.  At baseline pt normally able to carry on a conversation and ambulate independently.  Does have PEG tube in place for adult failure to thrive.  Does eat food PO.  As of last month family was only using PEG tube when he wouldn't take PO food, though while in hospital pt was noted to be "pocketing" food and having poor overall PO intake so he was put on scheduled PEG feeds to supplement. Pt had been admitted last month on 1/9-1/13 for COVID-19.  Also had constipation, hyponatremia, and adult failure to thrive as noted. Yesterday pt began having several bouts of diarrhea and incontinence.  Today pt more confused and non-ambulatory, prompting family to send pt in to ED.  ED course: Sodium 129, normal WBC. UA with small LE, positive nitrites. Initial lactate 2.2, 0.7 on repeat after 1L IVF. 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   Assessment & Plan:  Acute metabolic encephalopathy: -Likely in the setting of acute UTI, delirium secondary to severe dehydration from diarrhea  -MRI head: Negative for acute findings.  Reviewed CT abdomen/pelvis -UA concerning for infection.  Urine culture is pending -Continue IV antibiotics-Rocephin and IV fluids.  Await urine culture result -Consult PT/OT/SLP -Fall/aspiration precautions -Delirium precautions  Hypothermia: -Rectal temperature noted to be 95.5-patient placed on Bair hugger and temperature improved to 97.6. -Check TSH  Dehydration in the setting of diarrhea: -Continue IV fluids.  Lactic acid trended down -GI panel is pending  Enlarged prostate with urinary  retention: -Reviewed CT abdomen/pelvis result. -Foley placed in ED -PSA is elevated: 11.55 -We will start patient on Flomax.  Follow-up with urology outpatient-May need biopsy?  Type 2 diabetes mellitus: Uncontrolled.  Last A1c 7.7% checked on 1/10. -Hold Metformin in the setting of lactic acidosis.  Continue sliding scale insulin and monitor blood sugar closely  Adult failure to thrive: -Continue PEG tube feedings. -We will consult dietitian  Chronic hyponatremia: Sodium 131 -Repeat BMP tomorrow a.m.  Dementia: Continue Remeron  Goals of care: Patient has overall poor prognosis with multiple comorbidities.  I discussed with patient's son-who wishes to continue full scope of care at this time.  We will consult palliative care.  DVT prophylaxis: Lovenox Code Status: Full code-confirmed with patient's son Family Communication:  None present at bedside.  Plan of care discussed with patient in length and he verbalized understanding and agreed with it.  I called patient's son and discussed plan of care and he verbalized understanding  Disposition Plan: To be determined  Consultants:   None  Procedures:   None  Antimicrobials:   Rocephin  Status is: Observation   Dispo: The patient is from: Home              Anticipated d/c is to: SNF              Anticipated d/c date is: 2 days              Patient currently is not medically stable to d/c.   Difficult to place patient No         Subjective: Patient seen and examined in ED.  Resting comfortably  on the bed.  RN at the bedside reported patient hypothermic with rectal temperature of 95.  Placed on [employer]  Patient alert and following command but appears very dehydrated and confused.  Objective: Vitals:   11/12/20 0500 11/12/20 0600 11/12/20 0804 11/12/20 1005  BP: 109/69 (!) 117/58  (!) 104/53  Pulse: 62 78  (!) 59  Resp: 16 18  16   Temp:   (!) 95.5 F (35.3 C) 97.6 F (36.4 C)  TempSrc:   Rectal Rectal   SpO2: 99% 96%  98%    Intake/Output Summary (Last 24 hours) at 11/12/2020 1057 Last data filed at 11/12/2020 11/14/2020 Gross per 24 hour  Intake 791.99 ml  Output 1000 ml  Net -208.01 ml   There were no vitals filed for this visit.  Examination:  General exam: Appears weak, sick, dehydrated, on room air, appears confused Respiratory system: Clear to auscultation. Respiratory effort normal. Cardiovascular system: S1 & S2 heard, RRR. No JVD, murmurs, rubs, gallops or clicks. No pedal edema. Gastrointestinal system: Abdomen is nondistended, soft and nontender. No organomegaly or masses felt. Normal bowel sounds heard. Central nervous system: Alert and following commands Skin: No rashes, lesions or ulcers  Data Reviewed: I have personally reviewed following labs and imaging studies  CBC: Recent Labs  Lab 11/11/20 1332 11/12/20 0208  WBC 7.3 5.4  NEUTROABS 6.6  --   HGB 13.3 11.3*  HCT 40.0 32.6*  MCV 87.0 85.3  PLT 144* 183   Basic Metabolic Panel: Recent Labs  Lab 11/11/20 1332 11/12/20 0208  NA 129* 131*  K 4.5 4.1  CL 93* 99  CO2 26 24  GLUCOSE 263* 115*  BUN 25* 17  CREATININE 0.87 0.67  CALCIUM 10.3 9.6   GFR: CrCl cannot be calculated (Unknown ideal weight.). Liver Function Tests: Recent Labs  Lab 11/11/20 1332  AST 52*  ALT 56*  ALKPHOS 76  BILITOT 0.4  PROT 6.3*  ALBUMIN 3.2*   No results for input(s): LIPASE, AMYLASE in the last 168 hours. No results for input(s): AMMONIA in the last 168 hours. Coagulation Profile: Recent Labs  Lab 11/11/20 1332  INR 0.9   Cardiac Enzymes: No results for input(s): CKTOTAL, CKMB, CKMBINDEX, TROPONINI in the last 168 hours. BNP (last 3 results) No results for input(s): PROBNP in the last 8760 hours. HbA1C: No results for input(s): HGBA1C in the last 72 hours. CBG: Recent Labs  Lab 11/12/20 0059 11/12/20 0744  GLUCAP 110* 108*   Lipid Profile: No results for input(s): CHOL, HDL, LDLCALC, TRIG, CHOLHDL,  LDLDIRECT in the last 72 hours. Thyroid Function Tests: No results for input(s): TSH, T4TOTAL, FREET4, T3FREE, THYROIDAB in the last 72 hours. Anemia Panel: No results for input(s): VITAMINB12, FOLATE, FERRITIN, TIBC, IRON, RETICCTPCT in the last 72 hours. Sepsis Labs: Recent Labs  Lab 11/11/20 1332 11/11/20 2010  LATICACIDVEN 2.2* 0.7    No results found for this or any previous visit (from the past 240 hour(s)).    Radiology Studies: DG Chest 2 View  Result Date: 11/11/2020 CLINICAL DATA:  Possible sepsis. EXAM: CHEST - 2 VIEW COMPARISON:  Chest x-ray dated October 03, 2020. FINDINGS: The heart size and mediastinal contours are within normal limits. Both lungs are clear. The visualized skeletal structures are unremarkable. IMPRESSION: No active cardiopulmonary disease. Electronically Signed   By: October 05, 2020 M.D.   On: 11/11/2020 14:10   CT Head Wo Contrast  Result Date: 11/11/2020 CLINICAL DATA:  Altered mental status EXAM: CT HEAD WITHOUT  CONTRAST TECHNIQUE: Contiguous axial images were obtained from the base of the skull through the vertex without intravenous contrast. COMPARISON:  None. FINDINGS: Brain: There is mild to moderate diffuse atrophy. There is no intracranial mass, hemorrhage, extra-axial fluid collection, or midline shift. There is mild decreased attenuation in the centra semiovale bilaterally. No acute infarct is demonstrable. Vascular: There is no hyperdense vessel. There is calcification in each carotid siphon region. Skull: Bony calvarium appears intact. Sinuses/Orbits: Mucosal thickening noted in several ethmoid air cells. Apparent retention cyst in the right sphenoid sinus. Visualized orbits appear symmetric bilaterally. Other: Mastoid air cells are clear. There is debris in the right external auditory canal. IMPRESSION: Atrophy with mild patchy periventricular small vessel disease. No acute appearing infarct is evident. No mass or hemorrhage. Foci of arterial  vascular calcification noted. There are foci of paranasal sinus disease. There is probable cerumen in the right external auditory canal. Electronically Signed   By: Bretta BangWilliam  Woodruff III M.D.   On: 11/11/2020 15:31   MR BRAIN WO CONTRAST  Result Date: 11/11/2020 CLINICAL DATA:  Encephalopathy EXAM: MRI HEAD WITHOUT CONTRAST TECHNIQUE: Multiplanar, multiecho pulse sequences of the brain and surrounding structures were obtained without intravenous contrast. COMPARISON:  None. FINDINGS: Brain: No acute infarct, mass effect or extra-axial collection. No acute or chronic hemorrhage. There is multifocal hyperintense T2-weighted signal within the white matter. Advanced atrophy for age. There are multiple old right cerebellar infarcts. The midline structures are normal. Vascular: Major flow voids are preserved. Skull and upper cervical spine: Normal calvarium and skull base. Visualized upper cervical spine and soft tissues are normal. Sinuses/Orbits:No paranasal sinus fluid levels or advanced mucosal thickening. No mastoid or middle ear effusion. Normal orbits. IMPRESSION: 1. No acute intracranial abnormality. 2. 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 Electronically Signed   By: Deatra RobinsonKevin  Herman M.D.   On: 11/11/2020 23:00   CT ABDOMEN PELVIS W CONTRAST  Result Date: 11/11/2020 CLINICAL DATA:  Diarrhea. EXAM: CT ABDOMEN AND PELVIS WITH CONTRAST TECHNIQUE: Multidetector CT imaging of the abdomen and pelvis was performed using the standard protocol following bolus administration of intravenous contrast. CONTRAST:  100mL OMNIPAQUE IOHEXOL 300 MG/ML  SOLN COMPARISON:  10/04/2020 FINDINGS: Lower chest: Mild patchy airspace and ground-glass densities are noted within both lung bases. This is improved when compared with 10/04/2020 compatible with resolving pneumonia. Hepatobiliary: The gallbladder appears within normal limits. No bile duct dilatation. No focal liver  abnormality. Pancreas: Unremarkable. No pancreatic ductal dilatation or surrounding inflammatory changes. Spleen: Normal in size without focal abnormality. Adrenals/Urinary Tract: No focal adrenal mass. Small left kidney cysts are similar to the previous exam. No signs hydronephrosis. There is moderate distension of the urinary bladder with diffuse circumferential wall thickening. Marked enlargement of the prostate gland is identified which has mass effect upon the bladder base. Stomach/Bowel: Percutaneous gastrostomy tube is again noted. No abnormal bowel distension. The appendix is not confidently identified separate from the right lower quadrant bowel loops. No significant bowel wall thickening or inflammation. Vascular/Lymphatic: Aortic atherosclerosis. No aneurysm. No abdominopelvic adenopathy identified. Reproductive: Prostate gland measures 5.6 by 5.1 by 6.0 cm (volume = 90 cm^3). Other: No significant free fluid.  No fluid collection identified. Musculoskeletal: Lumbar degenerative disc disease. No acute or suspicious osseous findings. IMPRESSION: 1. No acute findings identified within the abdomen or pelvis. 2. Marked enlargement of the prostate gland with mass effect upon the bladder base. 3. Moderate distension of the urinary bladder with diffuse  circumferential wall thickening. Findings may reflect sequelae of chronic bladder outlet obstruction versus cystitis. 4. Improved appearance of bilateral lower lobe airspace and ground-glass densities compatible with resolving pneumonia. 5. Aortic atherosclerosis. Aortic Atherosclerosis (ICD10-I70.0). Electronically Signed   By: Signa Kell M.D.   On: 11/11/2020 20:13    Scheduled Meds: . enoxaparin (LOVENOX) injection  40 mg Subcutaneous QHS  . feeding supplement (OSMOLITE 1.5 CAL)  237 mL Per Tube QID  . insulin aspart  0-15 Units Subcutaneous TID WC  . insulin aspart  0-5 Units Subcutaneous QHS   Continuous Infusions: . sodium chloride Stopped  (11/12/20 0824)  . sodium chloride 125 mL/hr at 11/12/20 0825  . cefTRIAXone (ROCEPHIN)  IV Stopped (11/12/20 0142)     LOS: 0 days   Time spent: 35 minutes   Jamyson Jirak Estill Cotta, MD [facility]  If 7PM-7AM, please contact night-coverage [email or web address] 11/12/2020, 10:57 AM

## 2020-11-12 NOTE — ED Notes (Signed)
Bair Hugger removed for temp of 98.8 oral.  MD notified.

## 2020-11-12 NOTE — ED Notes (Signed)
Admitting at bedside 

## 2020-11-12 NOTE — ED Notes (Addendum)
Pt again refused to eat lunch, though we attempted.  Pt position changed.  PT states that he wants to go home.

## 2020-11-13 NOTE — Progress Notes (Addendum)
Civil engineer, contracting (ACC)  Spoke with Justin Hawkins, son and NOK, discussed hospice services and family agreed they want [facility] for end of life care. They do not feel they can care for him at home.  Will need to see if [facility] MD approves residential inpatient hospice.  Will update family and TOC once appropriateness has been confirmed by [facility] MD.  Thank you, Wallis Bamberg RN, BSN, CCRN [facility] Liaison   **Pt is eligible for residential hospice at [facility]. ACC will follow up with family and TOC once we have an open bed.

## 2020-11-13 NOTE — Progress Notes (Signed)
Nutrition Brief Note  RD consulted for tube feeding initiation and management.  RD working remotely.  Chart reviewed. Palliative met with family on 2/18. Pt has transitioned to comfort care. Plans to transfer to [facility] when bed available.   No further nutrition interventions warranted at this time.   Please re-consult as needed.   Lajuan Lines, RD, LDN Clinical Nutrition After Hours/Weekend Pager # in [place]

## 2020-11-13 NOTE — Progress Notes (Signed)
Daily Progress Note   Patient Name: Justin Hawkins       Date: 11/13/2020 DOB: [birth year]  Age: 80 y.o. MRN#: [id number] Attending Physician: Ollen Bowl, MD Primary Care Physician: [facility] Admit Date: 11/11/2020  Reason for Consultation/Follow-up: Non pain symptom management, Pain control, Psychosocial/spiritual support and Terminal Care  Subjective: Chart review performed. Received report from primary RN - no acute concerns. RN reports the patient has no PO intake, tube feedings have been stopped, and he is A/Ox0.   Went to visit patient at bedside - no family/visitors present. Patient was lying in bed asleep but he does wake to voice/gentle touch. His appears very frail and ill and his speech remains slurred at times, but overall appears comfortable today. I asked how he was feeling today - he states "I feel good," which is improvement from his appearance and answers yesterday. Mr. Osley is not able to tell me his name but does deny having any pain. No signs or non-verbal gestures of pain or discomfort noted. No respiratory distress, increased work of breathing, or secretions noted.   Called son/[name] to provide updates on condition as outlined above - he included his sister/patient's daughter/Shannon on the call. Carollee Herter had questions related to patient's diagnosis - reviewed dementia as a non-curable, progressive disease and it's expected trajectory - education provided on advanced dementia signs/symptoms. Shannon expressed understanding. Family were appreciative to hear the patient was comfortable today and free from suffering. Reviewed information provided to family from [facility] - family are ok with patient remaining in house for EOL care until bed is available/patient  passes if too unstable for transfer. [name] requested I call and speak with his other sister/patient's daughter/Pamela.  Called Rinaldo Cloud and provided updates on the patient's condition (as outlined above) per her request. She was appreciative to hear the patient was comfortable and free from suffering.   All questions and concerns addressed. Encouraged to call with questions and/or concerns. PMT number previously provided.    Length of Stay: 1  Current Medications: Scheduled Meds:  . antiseptic oral rinse  15 mL Topical BID  . mirtazapine  30 mg Oral QHS    Continuous Infusions:   PRN Meds: acetaminophen **OR** acetaminophen, glycopyrrolate **OR** glycopyrrolate **OR** glycopyrrolate, haloperidol **OR** haloperidol **OR** haloperidol lactate, LORazepam **OR** LORazepam **OR** LORazepam,  morphine injection, ondansetron **OR** ondansetron (ZOFRAN) IV, polyvinyl alcohol  Physical Exam Vitals and nursing note reviewed.  Constitutional:      General: He is not in acute distress.    Appearance: He is cachectic. He is ill-appearing.  Pulmonary:     Effort: No respiratory distress.  Skin:    General: Skin is warm and dry.  Neurological:     Mental Status: He is alert. He is disoriented and confused.     Motor: Weakness present.  Psychiatric:        Attention and Perception: Attention normal.        Behavior: Behavior is cooperative.        Cognition and Memory: Cognition is impaired. Memory is impaired.             Vital Signs: BP 111/80 (BP Location: Right Arm)   Pulse 75   Temp (!) 97.5 F (36.4 C) (Oral)   Resp 17   SpO2 99%  SpO2: SpO2: 99 % O2 Device: O2 Device: Room Air O2 Flow Rate:    Intake/output summary:   Intake/Output Summary (Last 24 hours) at 11/13/2020 1032 Last data filed at 11/13/2020 0404 Gross per 24 hour  Intake 0 ml  Output 150 ml  Net -150 ml   LBM: Last BM Date:  (not sure when last bm) Baseline Weight:   Most recent weight:          Palliative Assessment/Data: PPS 10%    Flowsheet Rows   Flowsheet Row Most Recent Value  Intake Tab   Referral Department Hospitalist  Unit at Time of Referral ER  Palliative Care Primary Diagnosis Neurology  Date Notified 11/12/20  Reason for referral Clarify Goals of Care  Date of Admission 11/12/20  Date first seen by Palliative Care 11/12/20  # of days Palliative referral response time 0 Day(s)  # of days IP prior to Palliative referral 0  Clinical Assessment   Psychosocial & Spiritual Assessment   Palliative Care Outcomes   Patient/Family meeting held? Yes  Who was at the meeting? son, DIL  Palliative Care Outcomes Improved pain interventions, Improved non-pain symptom therapy, Clarified goals of care, Counseled regarding hospice, Provided end of life care assistance, Provided advance care planning, Provided psychosocial or spiritual support, Changed to focus on comfort, Changed CPR status, Completed durable DNR, Transitioned to hospice  Patient/Family wishes: Interventions discontinued/not started  Mechanical Ventilation, Antibiotics, BiPAP, Tube feedings/TPN, Hemodialysis, NIPPV, Transfusion, Trach, Vasopressors      Patient Active Problem List   Diagnosis Date Noted  . Acute lower UTI 11/11/2020  . Acute metabolic encephalopathy 11/11/2020  . Enlarged prostate 11/11/2020  . Acute urinary retention 11/11/2020  . DM2 (diabetes mellitus, type 2) (HCC) 11/11/2020  . Dementia without behavioral disturbance (HCC) 10/07/2020  . Adult failure to thrive 10/07/2020  . COVID-19 virus infection 10/04/2020  . Normocytic normochromic anemia 10/04/2020    Palliative Care Assessment & Plan   Patient Profile: 80 y.o. male  with past medical history of advanced dementia, adult failure to thrive with PEG tube in place, stroke, DM presented to the ED on 11/12/20 from home with family concerns of AMS, now nonambulatory, and incontinence. He was admitted on 11/11/2020 with acute metabolic  encephalopathy, +UTI, dehydration, enlarged prostate with urinary retention, FTT.   ED course: Sodium 129, normal WBC. UA with small LE, positive nitrites. Initial lactate 2.2, 0.7 on repeat after 1L IVF.MRI brain = nothing acute CT a/p = marked prostate enlargement, mod distention of urinary  bladder, diffuse wall thickening = chronic bladder outlet obstruction vs cystitis.  Of note, he had a recent hospitalization from 1/9-1/13/22 for COVID19 infection. PMT saw patient during this hospitalization and at that time family opted for full code/full scope care.   Assessment: Acute metabolic encephalopathy Hypothermia Dehydration Enlarged prostate with urinary retention Adult failure to thrive Advanced dementia  Recommendations/Plan:  Continue full comfort measures  - likely 1 week or less  Continue DNR/DNI as previously documented  Transfer to [facility] when bed becomes available  No artificial feeding or hydration  Continue current EOL medication regimen - patient appears comfortable  Provide frequent assessments and administer PRN medications as clinically necessary to ensure EOL comfort  PMT will continue to follow and support holistically  Goals of Care and Additional Recommendations:  Limitations on Scope of Treatment: Full Comfort Care  Code Status:    Code Status Orders  (From admission, onward)         Start     Ordered   11/12/20 1624  Do not attempt resuscitation (DNR)  Continuous       Question Answer Comment  In the event of cardiac or respiratory ARREST Do not call a "code blue"   In the event of cardiac or respiratory ARREST Do not perform Intubation, CPR, defibrillation or ACLS   In the event of cardiac or respiratory ARREST Use medication by any route, position, wound care, and other measures to relive pain and suffering. May use oxygen, suction and manual treatment of airway obstruction as needed for comfort.      11/12/20 1631        Code  Status History    Date Active Date Inactive Code Status Order ID Comments User Context   11/12/2020 1620 11/12/2020 1631 DNR [id number]  Haskel Khan, NP Inpatient   11/11/2020 2331 11/12/2020 1620 Full Code [id number]  Hillary Bow, DO ED   10/04/2020 0331 10/07/2020 2026 Full Code [id number]  Eduard Clos, MD ED   Advance Care Planning Activity       Prognosis:   < 2 weeks  Discharge Planning:  Hospice facility  Care plan was discussed with primary RN, patient's family  Thank you for allowing the Palliative Medicine Team to assist in the care of this patient.   Total Time 40 minutes Prolonged Time Billed  no       Greater than 50%  of this time was spent counseling and coordinating care related to the above assessment and plan.  Haskel Khan, NP  Please contact Palliative Medicine Team phone at [phone] for questions and concerns.

## 2020-11-13 NOTE — Progress Notes (Signed)
PROGRESS NOTE    Justin Hawkins  XLK:[id number] DOB: Jan 08, 1941 DOA: 11/11/2020 PCP: [facility]   Brief Narrative:  Justin Hawkins is a 80 y.o. male with medical history significant of advanced dementia, adult failure to thrive, DM2, stroke.  At baseline pt normally able to carry on a conversation and ambulate independently.  Does have PEG tube in place for adult failure to thrive.  Does eat food PO.  As of last month family was only using PEG tube when he wouldn't take PO food, though while in hospital pt was noted to be "pocketing" food and having poor overall PO intake so he was put on scheduled PEG feeds to supplement. Pt had been admitted last month on 1/9-1/13 for COVID-19.  Also had constipation, hyponatremia, and adult failure to thrive as noted. Yesterday pt began having several bouts of diarrhea and incontinence.  Today pt more confused and non-ambulatory, prompting family to send pt in to ED.  ED course: Sodium 129, normal WBC. UA with small LE, positive nitrites. Initial lactate 2.2, 0.7 on repeat after 1L IVF. 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   Assessment & Plan:  Acute metabolic encephalopathy UTI Hypothermia Dehydration in the setting of diarrhea Enlarged prostate with urinary retention Type 2 diabetes mellitus Adult failure to thrive Chronic hyponatremia Dementia  Goals of care:  On 2/18: Palliative care consulted-patient's family decided to transition to full comfort care.  CODE STATUS changed from full code to DNR/DNI Patient will be transferred to [facility] when bed available. No artificial feeding/hydration Continue as needed medications as clinically indicated to keep patient comfortable  DVT prophylaxis: Lovenox Code Status: DNR/DNI  family Communication:  None present at bedside.  Plan of care discussed  with patient in length and he verbalized understanding and agreed with it.  Disposition Plan: [facility]  Consultants:   Palliative care  Procedures:   None  Antimicrobials:   Rocephin  Status is: Inpatient   Dispo: The patient is from: Home              Anticipated d/c is to:  hospice              Anticipated d/c date is: 1 to 2 days              Patient currently clinically stable for the discharge   Difficult to place patient No    Subjective: Patient seen and examined.  Appears calm and comfortable, on room air, alert and awake, denies any complaints.  No acute events overnight.  Remained afebrile.  Objective: Vitals:   11/12/20 1547 11/12/20 1700 11/12/20 2029 11/13/20 0300  BP: (!) 94/54 113/70 118/64 111/80  Pulse: 80 83 69 75  Resp: 12  20 17   Temp: 97.8 F (36.6 C) 97.6 F (36.4 C) 98 F (36.7 C) (!) 97.5 F (36.4 C)  TempSrc: Oral Oral Oral Oral  SpO2: 98% 100% 99%     Intake/Output Summary (Last 24 hours) at 11/13/2020 1250 Last data filed at 11/13/2020 0404 Gross per 24 hour  Intake 0 ml  Output 150 ml  Net -150 ml   There were no vitals filed for this visit.  Examination:  General exam: Appears weak, sick, on room air, thin and lean Respiratory system: Clear to auscultation. Respiratory effort normal. Cardiovascular system: S1 & S2 heard, RRR. No JVD, murmurs, rubs,  gallops or clicks. No pedal edema. Gastrointestinal system: Abdomen is nondistended, soft and nontender. No organomegaly or masses felt. Normal bowel sounds heard. Central nervous system: Alert and following commands Skin: No rashes, lesions or ulcers  Data Reviewed: I have personally reviewed following labs and imaging studies  CBC: Recent Labs  Lab 11/11/20 1332 11/12/20 0208  WBC 7.3 5.4  NEUTROABS 6.6  --   HGB 13.3 11.3*  HCT 40.0 32.6*  MCV 87.0 85.3  PLT 144* 183   Basic Metabolic Panel: Recent Labs  Lab 11/11/20 1332 11/12/20 0208  NA 129* 131*   K 4.5 4.1  CL 93* 99  CO2 26 24  GLUCOSE 263* 115*  BUN 25* 17  CREATININE 0.87 0.67  CALCIUM 10.3 9.6   GFR: CrCl cannot be calculated (Unknown ideal weight.). Liver Function Tests: Recent Labs  Lab 11/11/20 1332  AST 52*  ALT 56*  ALKPHOS 76  BILITOT 0.4  PROT 6.3*  ALBUMIN 3.2*   No results for input(s): LIPASE, AMYLASE in the last 168 hours. No results for input(s): AMMONIA in the last 168 hours. Coagulation Profile: Recent Labs  Lab 11/11/20 1332  INR 0.9   Cardiac Enzymes: No results for input(s): CKTOTAL, CKMB, CKMBINDEX, TROPONINI in the last 168 hours. BNP (last 3 results) No results for input(s): PROBNP in the last 8760 hours. HbA1C: No results for input(s): HGBA1C in the last 72 hours. CBG: Recent Labs  Lab 11/12/20 0059 11/12/20 0744 11/12/20 1258  GLUCAP 110* 108* 236*   Lipid Profile: No results for input(s): CHOL, HDL, LDLCALC, TRIG, CHOLHDL, LDLDIRECT in the last 72 hours. Thyroid Function Tests: Recent Labs    11/12/20 0208  TSH 3.174   Anemia Panel: No results for input(s): VITAMINB12, FOLATE, FERRITIN, TIBC, IRON, RETICCTPCT in the last 72 hours. Sepsis Labs: Recent Labs  Lab 11/11/20 1332 11/11/20 2010  LATICACIDVEN 2.2* 0.7    Recent Results (from the past 240 hour(s))  Culture, blood (Routine x 2)     Status: None (Preliminary result)   Collection Time: 11/11/20  1:32 PM   Specimen: BLOOD  Result Value Ref Range Status   Specimen Description BLOOD RIGHT ANTECUBITAL  Final   Special Requests   Final    BOTTLES DRAWN AEROBIC AND ANAEROBIC Blood Culture results may not be optimal due to an inadequate volume of blood received in culture bottles   Culture   Final    NO GROWTH 2 DAYS Performed at [facility], [address] [place], [place] [address]    Report Status PENDING  Incomplete  Culture, blood (Routine x 2)     Status: None (Preliminary result)   Collection Time: 11/11/20  5:10 PM   Specimen: BLOOD LEFT ARM   Result Value Ref Range Status   Specimen Description BLOOD LEFT ARM  Final   Special Requests   Final    BOTTLES DRAWN AEROBIC AND ANAEROBIC Blood Culture adequate volume   Culture   Final    NO GROWTH 2 DAYS Performed at [facility], [address] [place], [place] [address]    Report Status PENDING  Incomplete  Urine culture     Status: Abnormal (Preliminary result)   Collection Time: 11/11/20 11:23 PM   Specimen: Urine, Random  Result Value Ref Range Status   Specimen Description URINE, RANDOM  Final   Special Requests NONE  Final   Culture (A)  Final    >=100,000 COLONIES/mL STAPHYLOCOCCUS EPIDERMIDIS SUSCEPTIBILITIES TO FOLLOW Performed  at [facility], [address] [place], [place] [address]    Report Status PENDING  Incomplete      Radiology Studies: DG Chest 2 View  Result Date: 11/11/2020 CLINICAL DATA:  Possible sepsis. EXAM: CHEST - 2 VIEW COMPARISON:  Chest x-ray dated October 03, 2020. FINDINGS: The heart size and mediastinal contours are within normal limits. Both lungs are clear. The visualized skeletal structures are unremarkable. IMPRESSION: No active cardiopulmonary disease. Electronically Signed   By: Obie Dredge M.D.   On: 11/11/2020 14:10   CT Head Wo Contrast  Result Date: 11/11/2020 CLINICAL DATA:  Altered mental status EXAM: CT HEAD WITHOUT CONTRAST TECHNIQUE: Contiguous axial images were obtained from the base of the skull through the vertex without intravenous contrast. COMPARISON:  None. FINDINGS: Brain: There is mild to moderate diffuse atrophy. There is no intracranial mass, hemorrhage, extra-axial fluid collection, or midline shift. There is mild decreased attenuation in the centra semiovale bilaterally. No acute infarct is demonstrable. Vascular: There is no hyperdense vessel. There is calcification in each carotid siphon region. Skull: Bony calvarium appears intact. Sinuses/Orbits: Mucosal thickening noted in several ethmoid air  cells. Apparent retention cyst in the right sphenoid sinus. Visualized orbits appear symmetric bilaterally. Other: Mastoid air cells are clear. There is debris in the right external auditory canal. IMPRESSION: Atrophy with mild patchy periventricular small vessel disease. No acute appearing infarct is evident. No mass or hemorrhage. Foci of arterial vascular calcification noted. There are foci of paranasal sinus disease. There is probable cerumen in the right external auditory canal. Electronically Signed   By: Bretta Bang III M.D.   On: 11/11/2020 15:31   MR BRAIN WO CONTRAST  Result Date: 11/11/2020 CLINICAL DATA:  Encephalopathy EXAM: MRI HEAD WITHOUT CONTRAST TECHNIQUE: Multiplanar, multiecho pulse sequences of the brain and surrounding structures were obtained without intravenous contrast. COMPARISON:  None. FINDINGS: Brain: No acute infarct, mass effect or extra-axial collection. No acute or chronic hemorrhage. There is multifocal hyperintense T2-weighted signal within the white matter. Advanced atrophy for age. There are multiple old right cerebellar infarcts. The midline structures are normal. Vascular: Major flow voids are preserved. Skull and upper cervical spine: Normal calvarium and skull base. Visualized upper cervical spine and soft tissues are normal. Sinuses/Orbits:No paranasal sinus fluid levels or advanced mucosal thickening. No mastoid or middle ear effusion. Normal orbits. IMPRESSION: 1. No acute intracranial abnormality. 2. 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 Electronically Signed   By: Deatra Robinson M.D.   On: 11/11/2020 23:00   CT ABDOMEN PELVIS W CONTRAST  Result Date: 11/11/2020 CLINICAL DATA:  Diarrhea. EXAM: CT ABDOMEN AND PELVIS WITH CONTRAST TECHNIQUE: Multidetector CT imaging of the abdomen and pelvis was performed using the standard protocol following bolus administration of intravenous contrast. CONTRAST:   OMNIPAQUE IOHEXOL 300 MG/ML  SOLN COMPARISON:  10/04/2020 FINDINGS: Lower chest: Mild patchy airspace and ground-glass densities are noted within both lung bases. This is improved when compared with 10/04/2020 compatible with resolving pneumonia. Hepatobiliary: The gallbladder appears within normal limits. No bile duct dilatation. No focal liver abnormality. Pancreas: Unremarkable. No pancreatic ductal dilatation or surrounding inflammatory changes. Spleen: Normal in size without focal abnormality. Adrenals/Urinary Tract: No focal adrenal mass. Small left kidney cysts are similar to the previous exam. No signs hydronephrosis. There is moderate distension of the urinary bladder with diffuse circumferential wall thickening. Marked enlargement of the prostate gland is identified which has mass effect upon  the bladder base. Stomach/Bowel: Percutaneous gastrostomy tube is again noted. No abnormal bowel distension. The appendix is not confidently identified separate from the right lower quadrant bowel loops. No significant bowel wall thickening or inflammation. Vascular/Lymphatic: Aortic atherosclerosis. No aneurysm. No abdominopelvic adenopathy identified. Reproductive: Prostate gland measures 5.6 by 5.1 by 6.0 cm (volume = 90 cm^3). Other: No significant free fluid.  No fluid collection identified. Musculoskeletal: Lumbar degenerative disc disease. No acute or suspicious osseous findings. IMPRESSION: 1. No acute findings identified within the abdomen or pelvis. 2. Marked enlargement of the prostate gland with mass effect upon the bladder base. 3. Moderate distension of the urinary bladder with diffuse circumferential wall thickening. Findings may reflect sequelae of chronic bladder outlet obstruction versus cystitis. 4. Improved appearance of bilateral lower lobe airspace and ground-glass densities compatible with resolving pneumonia. 5. Aortic atherosclerosis. Aortic Atherosclerosis (ICD10-I70.0). Electronically  Signed   By: Signa Kell M.D.   On: 11/11/2020 20:13    Scheduled Meds: . antiseptic oral rinse  15 mL Topical BID   Continuous Infusions:    LOS: 1 day   Time spent: 35 minutes   Lylla Eifler Estill Cotta, MD [facility]  If 7PM-7AM, please contact night-coverage [email or web address] 11/13/2020, 12:50 PM

## 2020-11-14 LAB — URINE CULTURE: Culture: >=100000 — AB

## 2020-11-14 MED ORDER — LORAZEPAM 1 MG PO TABS: 1.0000 mg | ORAL_TABLET | ORAL | 0 refills | Status: AC | PRN

## 2020-11-14 MED ORDER — HALOPERIDOL 2 MG PO TABS
2.0000 mg | ORAL_TABLET | Freq: Four times a day (QID) | ORAL | Status: AC | PRN
Start: 2020-11-14 — End: ?

## 2020-11-14 MED ORDER — HALOPERIDOL LACTATE 2 MG/ML PO CONC: 2.0000 mg | Freq: Four times a day (QID) | ORAL | 0 refills | Status: AC | PRN

## 2020-11-14 MED ORDER — GLYCOPYRROLATE 1 MG PO TABS: 1.0000 mg | ORAL_TABLET | ORAL | Status: AC | PRN

## 2020-11-14 MED ORDER — MORPHINE SULFATE (PF) 2 MG/ML IV SOLN: 2.0000 mg | INTRAVENOUS | 0 refills | Status: AC | PRN

## 2020-11-14 MED ORDER — ONDANSETRON HCL 4 MG PO TABS: 4.0000 mg | ORAL_TABLET | Freq: Four times a day (QID) | ORAL | 0 refills | Status: AC | PRN

## 2020-11-14 MED ORDER — BIOTENE DRY MOUTH MT LIQD: 15.0000 mL | Freq: Two times a day (BID) | OROMUCOSAL | Status: AC

## 2020-11-14 MED ORDER — POLYVINYL ALCOHOL 1.4 % OP SOLN: 1.0000 [drp] | Freq: Four times a day (QID) | OPHTHALMIC | 0 refills | Status: AC | PRN

## 2020-11-14 MED ORDER — ACETAMINOPHEN 325 MG PO TABS: 650.0000 mg | ORAL_TABLET | Freq: Four times a day (QID) | ORAL | Status: AC | PRN

## 2020-11-14 NOTE — Progress Notes (Signed)
Civil engineer, contracting [facility])  Received request from [facility] for family interest in [place] with request for transfer today. Chart reviewed and eligibility confirmed.   Spoke to son to confirm interest and explain services. Family agreeable to transfer today. CSW aware.  Registration paperwork will need to be completed. Dr. Kern Reap to assume care per family request.    RN please call report to [phone]. Please arrange transport for patient. I will let you know once consents have been signed.   Thank you,   Yolande Jolly, BSN, RN     [facility] Liaison (listed on AMION under [facility] [place])   [phone]

## 2020-11-14 NOTE — Discharge Summary (Addendum)
Physician Discharge Summary  ENOC GETTER SWF:[id number] DOB: 1941-05-27 DOA: 11/11/2020  PCP: [facility]  Admit date: 11/11/2020 Discharge date: 11/14/2020  Admitted From: home Disposition: Inpatient hospice-[facility]   Home Health: None Equipment/Devices: None Discharge Condition: Fair  CODE STATUS: DNR Diet recommendation: Regular diet  Brief/Interim Summary: Justin Hawkins a 80 y.o.malewith medical history significant ofadvanced dementia, adult failure to thrive, DM2, stroke.  At baseline pt normally able to carry on a conversation and ambulate independently. Does have PEG tube in place for adult failure to thrive. Does eat food PO. As of last month family was only using PEG tube when he wouldn't take PO food, though while in hospital pt was noted to be "pocketing" food and having poor overall PO intake so he was put on scheduled PEG feeds to supplement. Pt had been admitted last month on 1/9-1/13 for COVID-19. Also had constipation, hyponatremia, and adult failure to thrive as noted. Due to diarrhea, incontinence and confusion-family brought patient to the ER for further evaluation and management.  ED course: Sodium 129, normal WBC. UA with small LE, positive nitrites. Initial lactate 2.2, 0.7 on repeat after 1L IVF. MRI brain = nothing acute. CT a/p = marked prostate enlargement, mod distention of urinary bladder, diffuse wall thickening = chronic bladder outlet obstruction vs cystitis.  Foley was placed.  Patient started on IV fluids and antibiotics and admitted under hospitalist service.  Acute metabolic encephalopathy UTI Hypothermia Dehydration in the setting of diarrhea Enlarged prostate with urinary retention Type 2 diabetes mellitus Adult failure to thrive Chronic hyponatremia Dementia  On 2/18: Palliative care consulted-patient's family decided to transitioned to full comfort care.  CODE STATUS changed from full code to DNR/DNI.  Family  requested for inpatient hospice care.  Artificial feedings discontinued.  As needed medications ordered per palliative care.  TOC & ACC consulted.  2/20: Patient will be discharged to [facility] today.  I called patient's son and discussed plan of care and he verbalized understanding.  Discharge Diagnoses:  Acute metabolic encephalopathy UTI Hypothermia Dehydration in the setting of diarrhea Enlarged prostate with urinary retention Type 2 diabetes mellitus Adult failure to thrive Chronic hyponatremia Dementia   Discharge Instructions  Discharge Instructions    Diet general   Complete by: As directed      Allergies as of 11/14/2020   No Known Allergies     Medication List    STOP taking these medications   atorvastatin 10 MG tablet Commonly known as: LIPITOR   cyproheptadine 4 MG tablet Commonly known as: PERIACTIN   metFORMIN 1000 MG tablet Commonly known as: GLUCOPHAGE   mirtazapine 30 MG tablet Commonly known as: REMERON   pravastatin 20 MG tablet Commonly known as: PRAVACHOL     TAKE these medications   acetaminophen 325 MG tablet Commonly known as: TYLENOL Take 2 tablets (650 mg total) by mouth every 6 (six) hours as needed for mild Hawkins (or Fever >/= 101).   antiseptic oral rinse Liqd Apply 15 mLs topically 2 (two) times daily.   feeding supplement (OSMOLITE 1.5 CAL) Liqd Place 237 mLs into feeding tube 4 (four) times daily.   glycopyrrolate 1 MG tablet Commonly known as: ROBINUL Take 1 tablet (1 mg total) by mouth every 4 (four) hours as needed (excessive secretions).   haloperidol 2 MG tablet Commonly known as: HALDOL Take 1 tablet (2 mg total) by mouth every 6 (six) hours as needed for agitation (or delirium).   haloperidol 2 MG/ML solution  Commonly known as: HALDOL Place 1 mL (2 mg total) under the tongue every 6 (six) hours as needed for agitation (or delirium).   LORazepam 1 MG tablet Commonly known as: ATIVAN Take 1 tablet (1 mg  total) by mouth every hour as needed for anxiety, seizure, sedation or sleep.   morphine 2 MG/ML injection Inject 1 mL (2 mg total) into the vein every 2 (two) hours as needed (dyspnea, increased work of breathing, respiratory rate >25).   ondansetron 4 MG tablet Commonly known as: ZOFRAN Take 1 tablet (4 mg total) by mouth every 6 (six) hours as needed for nausea.   polyethylene glycol 17 g packet Commonly known as: MIRALAX / GLYCOLAX Place 17 g into feeding tube daily.   polyvinyl alcohol 1.4 % ophthalmic solution Commonly known as: LIQUIFILM TEARS Place 1 drop into both eyes 4 (four) times daily as needed for dry eyes.   senna-docusate 8.6-50 MG tablet Commonly known as: Senokot-S Take 2 tablets by mouth 2 (two) times daily.       No Known Allergies  Consultations:  Palliative care   Procedures/Studies: DG Chest 2 View  Result Date: 11/11/2020 CLINICAL DATA:  Possible sepsis. EXAM: CHEST - 2 VIEW COMPARISON:  Chest x-ray dated October 03, 2020. FINDINGS: The heart size and mediastinal contours are within normal limits. Both lungs are clear. The visualized skeletal structures are unremarkable. IMPRESSION: No active cardiopulmonary disease. Electronically Signed   By: Obie Dredge M.D.   On: 11/11/2020 14:10   CT Head Wo Contrast  Result Date: 11/11/2020 CLINICAL DATA:  Altered mental status EXAM: CT HEAD WITHOUT CONTRAST TECHNIQUE: Contiguous axial images were obtained from the base of the skull through the vertex without intravenous contrast. COMPARISON:  None. FINDINGS: Brain: There is mild to moderate diffuse atrophy. There is no intracranial mass, hemorrhage, extra-axial fluid collection, or midline shift. There is mild decreased attenuation in the centra semiovale bilaterally. No acute infarct is demonstrable. Vascular: There is no hyperdense vessel. There is calcification in each carotid siphon region. Skull: Bony calvarium appears intact. Sinuses/Orbits: Mucosal  thickening noted in several ethmoid air cells. Apparent retention cyst in the right sphenoid sinus. Visualized orbits appear symmetric bilaterally. Other: Mastoid air cells are clear. There is debris in the right external auditory canal. IMPRESSION: Atrophy with mild patchy periventricular small vessel disease. No acute appearing infarct is evident. No mass or hemorrhage. Foci of arterial vascular calcification noted. There are foci of paranasal sinus disease. There is probable cerumen in the right external auditory canal. Electronically Signed   By: Bretta Bang III M.D.   On: 11/11/2020 15:31   MR BRAIN WO CONTRAST  Result Date: 11/11/2020 CLINICAL DATA:  Encephalopathy EXAM: MRI HEAD WITHOUT CONTRAST TECHNIQUE: Multiplanar, multiecho pulse sequences of the brain and surrounding structures were obtained without intravenous contrast. COMPARISON:  None. FINDINGS: Brain: No acute infarct, mass effect or extra-axial collection. No acute or chronic hemorrhage. There is multifocal hyperintense T2-weighted signal within the white matter. Advanced atrophy for age. There are multiple old right cerebellar infarcts. The midline structures are normal. Vascular: Major flow voids are preserved. Skull and upper cervical spine: Normal calvarium and skull base. Visualized upper cervical spine and soft tissues are normal. Sinuses/Orbits:No paranasal sinus fluid levels or advanced mucosal thickening. No mastoid or middle ear effusion. Normal orbits. IMPRESSION: 1. No acute intracranial abnormality. 2. 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 Electronically Signed   By: Chrisandra Netters.D.  On: 11/11/2020 23:00   CT ABDOMEN PELVIS W CONTRAST  Result Date: 11/11/2020 CLINICAL DATA:  Diarrhea. EXAM: CT ABDOMEN AND PELVIS WITH CONTRAST TECHNIQUE: Multidetector CT imaging of the abdomen and pelvis was performed using the standard protocol following bolus administration  of intravenous contrast. CONTRAST:  100mL OMNIPAQUE IOHEXOL 300 MG/ML  SOLN COMPARISON:  10/04/2020 FINDINGS: Lower chest: Mild patchy airspace and ground-glass densities are noted within both lung bases. This is improved when compared with 10/04/2020 compatible with resolving pneumonia. Hepatobiliary: The gallbladder appears within normal limits. No bile duct dilatation. No focal liver abnormality. Pancreas: Unremarkable. No pancreatic ductal dilatation or surrounding inflammatory changes. Spleen: Normal in size without focal abnormality. Adrenals/Urinary Tract: No focal adrenal mass. Small left kidney cysts are similar to the previous exam. No signs hydronephrosis. There is moderate distension of the urinary bladder with diffuse circumferential wall thickening. Marked enlargement of the prostate gland is identified which has mass effect upon the bladder base. Stomach/Bowel: Percutaneous gastrostomy tube is again noted. No abnormal bowel distension. The appendix is not confidently identified separate from the right lower quadrant bowel loops. No significant bowel wall thickening or inflammation. Vascular/Lymphatic: Aortic atherosclerosis. No aneurysm. No abdominopelvic adenopathy identified. Reproductive: Prostate gland measures 5.6 by 5.1 by 6.0 cm (volume = 90 cm^3). Other: No significant free fluid.  No fluid collection identified. Musculoskeletal: Lumbar degenerative disc disease. No acute or suspicious osseous findings. IMPRESSION: 1. No acute findings identified within the abdomen or pelvis. 2. Marked enlargement of the prostate gland with mass effect upon the bladder base. 3. Moderate distension of the urinary bladder with diffuse circumferential wall thickening. Findings may reflect sequelae of chronic bladder outlet obstruction versus cystitis. 4. Improved appearance of bilateral lower lobe airspace and ground-glass densities compatible with resolving pneumonia. 5. Aortic atherosclerosis. Aortic  Atherosclerosis (ICD10-I70.0). Electronically Signed   By: Signa Kellaylor  Stroud M.D.   On: 11/11/2020 20:13       Subjective: Patient seen and examined.  Lying comfortably on the bed.  No new complaints.  No acute events overnight.  Remained afebrile.  Discharge Exam: Vitals:   11/13/20 1430 11/13/20 2336  BP: 132/85 136/81  Pulse: 71 (!) 55  Resp: 16 16  Temp:  (!) 97 F (36.1 C)  SpO2: 100% 100%   Vitals:   11/12/20 2029 11/13/20 0300 11/13/20 1430 11/13/20 2336  BP: 118/64 111/80 132/85 136/81  Pulse: 69 75 71 (!) 55  Resp: 20 17 16 16   Temp: 98 F (36.7 C) (!) 97.5 F (36.4 C)  (!) 97 F (36.1 C)  TempSrc: Oral Oral    SpO2: 99%  100% 100%    General: Pt is alert, awake, not in acute distress, on room air, appears weak, sick, dehydrated, very cachectic Cardiovascular: RRR, S1/S2 +, no rubs, no gallops Respiratory: CTA bilaterally, no wheezing, no rhonchi Abdominal: Soft, NT, ND, bowel sounds + Extremities: no edema, no cyanosis    The results of significant diagnostics from this hospitalization (including imaging, microbiology, ancillary and laboratory) are listed below for reference.     Microbiology: Recent Results (from the past 240 hour(s))  Culture, blood (Routine x 2)     Status: None (Preliminary result)   Collection Time: 11/11/20  1:32 PM   Specimen: BLOOD  Result Value Ref Range Status   Specimen Description BLOOD RIGHT ANTECUBITAL  Final   Special Requests   Final    BOTTLES DRAWN AEROBIC AND ANAEROBIC Blood Culture results may not be optimal due to an inadequate volume  of blood received in culture bottles   Culture   Final    NO GROWTH 2 DAYS Performed at [facility], [address], [place], [place] [address]    Report Status PENDING  Incomplete  Culture, blood (Routine x 2)     Status: None (Preliminary result)   Collection Time: 11/11/20  5:10 PM   Specimen: BLOOD LEFT ARM  Result Value Ref Range Status   Specimen Description BLOOD LEFT  ARM  Final   Special Requests   Final    BOTTLES DRAWN AEROBIC AND ANAEROBIC Blood Culture adequate volume   Culture   Final    NO GROWTH 2 DAYS Performed at [facility], [address] [place], [place] [address]    Report Status PENDING  Incomplete  Urine culture     Status: Abnormal (Preliminary result)   Collection Time: 11/11/20 11:23 PM   Specimen: Urine, Random  Result Value Ref Range Status   Specimen Description URINE, RANDOM  Final   Special Requests NONE  Final   Culture (A)  Final    >=100,000 COLONIES/mL STAPHYLOCOCCUS EPIDERMIDIS SUSCEPTIBILITIES TO FOLLOW Performed at [facility], [address] [place], [place] [address]    Report Status PENDING  Incomplete     Labs: BNP (last 3 results) Recent Labs    10/03/20 2250  BNP 46.0   Basic Metabolic Panel: Recent Labs  Lab 11/11/20 1332 11/12/20 0208  NA 129* 131*  K 4.5 4.1  CL 93* 99  CO2 26 24  GLUCOSE 263* 115*  BUN 25* 17  CREATININE 0.87 0.67  CALCIUM 10.3 9.6   Liver Function Tests: Recent Labs  Lab 11/11/20 1332  AST 52*  ALT 56*  ALKPHOS 76  BILITOT 0.4  PROT 6.3*  ALBUMIN 3.2*   No results for input(s): LIPASE, AMYLASE in the last 168 hours. No results for input(s): AMMONIA in the last 168 hours. CBC: Recent Labs  Lab 11/11/20 1332 11/12/20 0208  WBC 7.3 5.4  NEUTROABS 6.6  --   HGB 13.3 11.3*  HCT 40.0 32.6*  MCV 87.0 85.3  PLT 144* 183   Cardiac Enzymes: No results for input(s): CKTOTAL, CKMB, CKMBINDEX, TROPONINI in the last 168 hours. BNP: Invalid input(s): POCBNP CBG: Recent Labs  Lab 11/12/20 0059 11/12/20 0744 11/12/20 1258  GLUCAP 110* 108* 236*   D-Dimer No results for input(s): DDIMER in the last 72 hours. Hgb A1c No results for input(s): HGBA1C in the last 72 hours. Lipid Profile No results for input(s): CHOL, HDL, LDLCALC, TRIG, CHOLHDL, LDLDIRECT in the last 72 hours. Thyroid function studies Recent Labs    11/12/20 0208  TSH  3.174   Anemia work up No results for input(s): VITAMINB12, FOLATE, FERRITIN, TIBC, IRON, RETICCTPCT in the last 72 hours. Urinalysis    Component Value Date/Time   COLORURINE YELLOW 11/11/2020 1811   APPEARANCEUR CLEAR 11/11/2020 1811   LABSPEC 1.009 11/11/2020 1811   PHURINE 6.0 11/11/2020 1811   GLUCOSEU >=500 (A) 11/11/2020 1811   HGBUR NEGATIVE 11/11/2020 1811   BILIRUBINUR NEGATIVE 11/11/2020 1811   KETONESUR NEGATIVE 11/11/2020 1811   PROTEINUR NEGATIVE 11/11/2020 1811   NITRITE POSITIVE (A) 11/11/2020 1811   LEUKOCYTESUR SMALL (A) 11/11/2020 1811   Sepsis Labs Invalid input(s): PROCALCITONIN,  WBC,  LACTICIDVEN Microbiology Recent Results (from the past 240 hour(s))  Culture, blood (Routine x 2)     Status: None (Preliminary result)   Collection Time: 11/11/20  1:32 PM  Specimen: BLOOD  Result Value Ref Range Status   Specimen Description BLOOD RIGHT ANTECUBITAL  Final   Special Requests   Final    BOTTLES DRAWN AEROBIC AND ANAEROBIC Blood Culture results may not be optimal due to an inadequate volume of blood received in culture bottles   Culture   Final    NO GROWTH 2 DAYS Performed at [facility], [address] [place], [place] [address]    Report Status PENDING  Incomplete  Culture, blood (Routine x 2)     Status: None (Preliminary result)   Collection Time: 11/11/20  5:10 PM   Specimen: BLOOD LEFT ARM  Result Value Ref Range Status   Specimen Description BLOOD LEFT ARM  Final   Special Requests   Final    BOTTLES DRAWN AEROBIC AND ANAEROBIC Blood Culture adequate volume   Culture   Final    NO GROWTH 2 DAYS Performed at [facility], [address] [place], [place] [address]    Report Status PENDING  Incomplete  Urine culture     Status: Abnormal (Preliminary result)   Collection Time: 11/11/20 11:23 PM   Specimen: Urine, Random  Result Value Ref Range Status   Specimen Description URINE, RANDOM  Final   Special Requests NONE  Final    Culture (A)  Final    >=100,000 COLONIES/mL STAPHYLOCOCCUS EPIDERMIDIS SUSCEPTIBILITIES TO FOLLOW Performed at [facility], [address], [place], [place] [address]    Report Status PENDING  Incomplete     Time coordinating discharge: Over 30 minutes  SIGNED:   Ollen Bowl, MD  [facility] 11/14/2020, 10:19 AM Pager   If 7PM-7AM, please contact night-coverage [email or web address]

## 2020-11-14 NOTE — TOC Transition Note (Addendum)
Transition of Care [facility]) - CM/SW Discharge Note   Patient Details  Name: Justin Hawkins MRN: [id number] Date of Birth: [birth year]  [facility] CM/SW Contact:  Kermit Balo, RN Phone Number: 11/14/2020, 12:18 PM   Clinical Narrative:    Pt is discharging to [facility] today. Pt transporting via [facility]. Son called and in agreement with the plan. Bedside RN updated and d/c packet at the desk.   Number for report: [phone]   Final next level of care: Hospice Medical Facility Barriers to Discharge: No Barriers Identified   Patient Goals and CMS Choice        Discharge Placement                Patient to be transferred to facility by: [facility]      Discharge Plan and Services                                     Social Determinants of Health (SDOH) Interventions     Readmission Risk Interventions No flowsheet data found.

## 2020-11-16 LAB — CULTURE, BLOOD (ROUTINE X 2)
Culture: NO GROWTH
Culture: NO GROWTH
Special Requests: ADEQUATE

## 2021-03-25 DEATH — deceased

## 2021-05-13 IMAGING — CT CT ABD-PELV W/ CM
2 of 5 series · 16 of 46 positions shown, 18 images · IV contrast (APPLIED)
Comparison: 10/04/2020

CLINICAL DATA: Diarrhea.

EXAM:
CT ABDOMEN AND PELVIS WITH CONTRAST
TECHNIQUE: Multidetector CT imaging of the abdomen and pelvis was performed
using the standard protocol following bolus administration of
intravenous contrast.
CONTRAST:  100mL OMNIPAQUE IOHEXOL 300 MG/ML  SOLN

[Series 5: abd/ pelvis 5.0 i30f 2 · axial · 0.73mm/px · z∈[+885,+1275]mm · 13 of 88 slices shown, 15 images]
[im 5/88  soft-tissue]
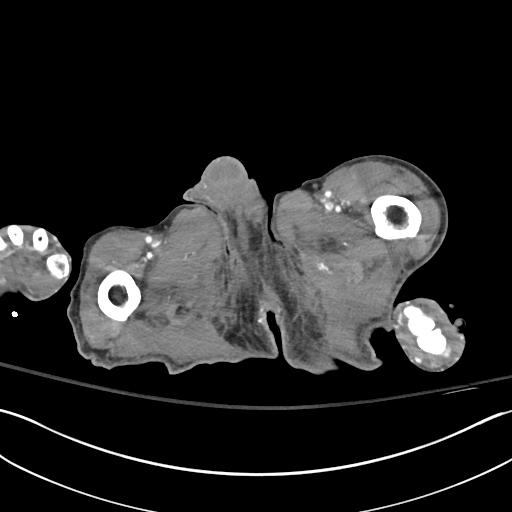
[im 5/88  bone]
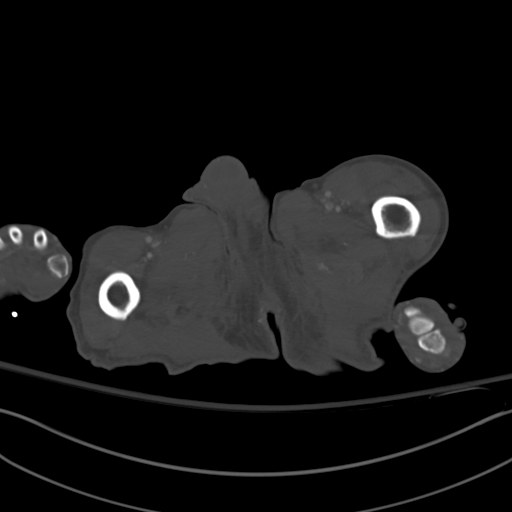
[im 10/88  soft-tissue]
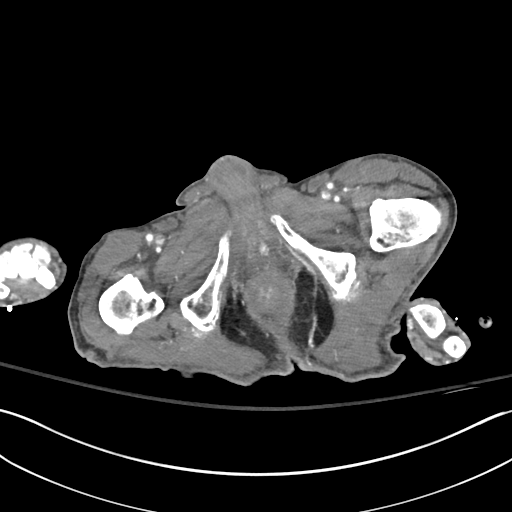
[im 20/88  soft-tissue]
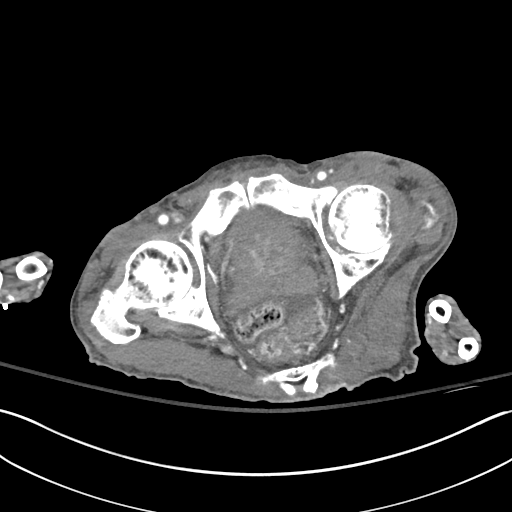
[im 25/88  soft-tissue]
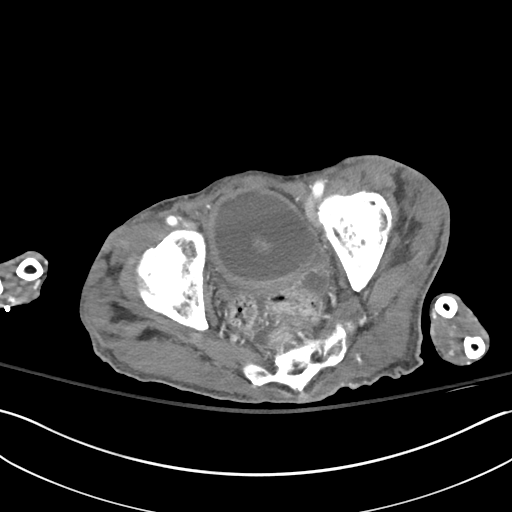
[im 30/88  soft-tissue]
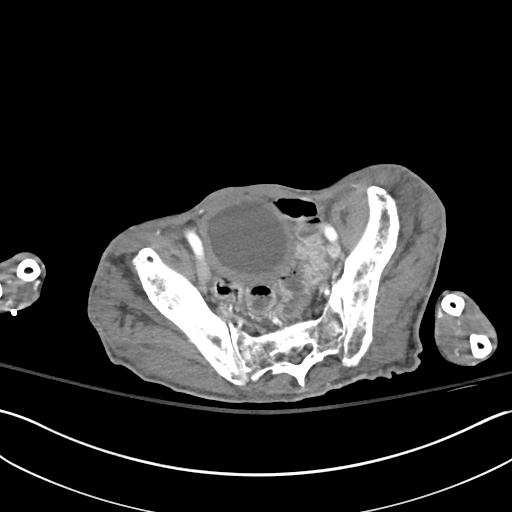
[im 39/88  soft-tissue]
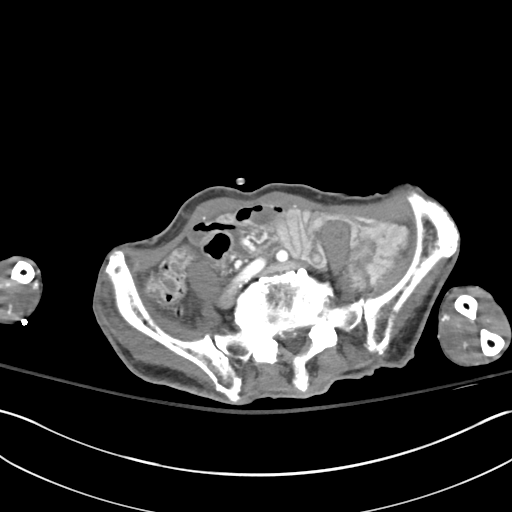
[im 44/88  soft-tissue]
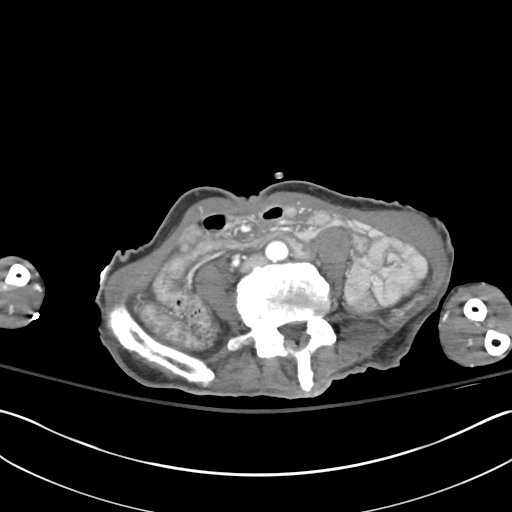
[im 49/88  soft-tissue]
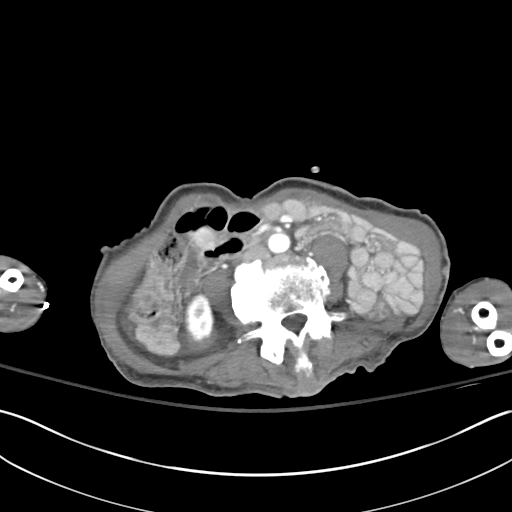
[im 59/88  soft-tissue]
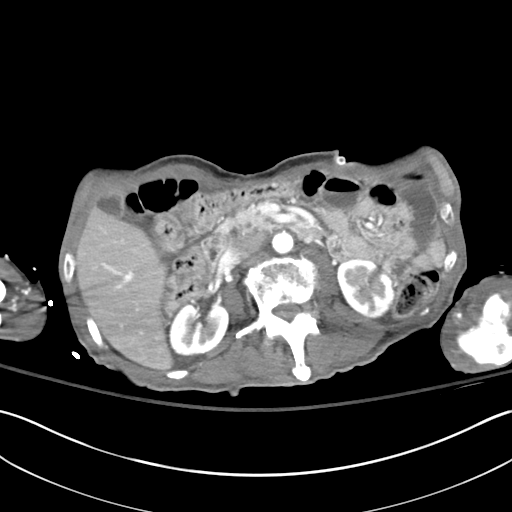
[im 59/88  bone]
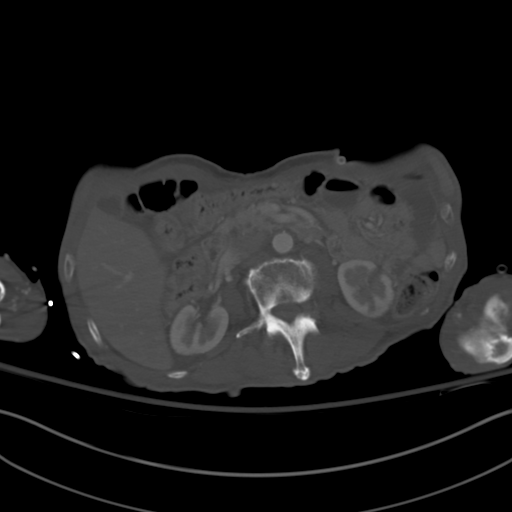
[im 63/88  soft-tissue]
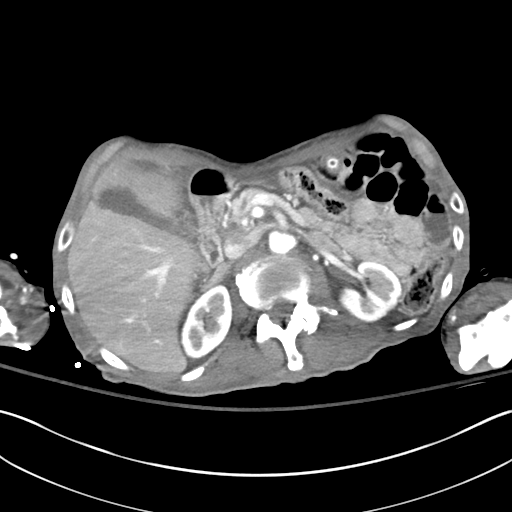
[im 68/88  soft-tissue]
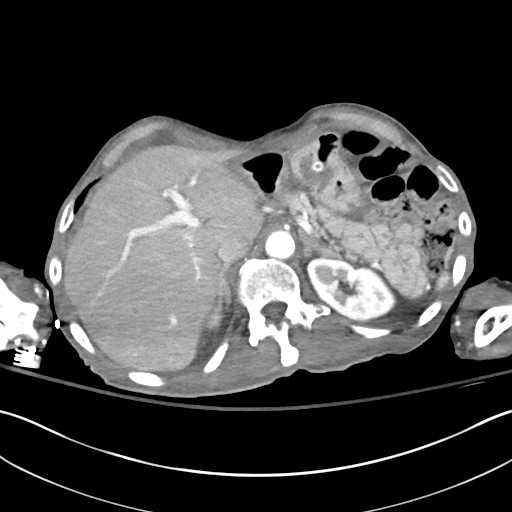
[im 78/88  soft-tissue]
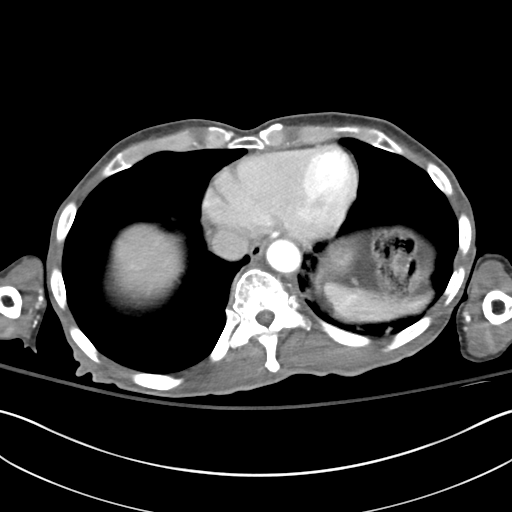
[im 83/88  soft-tissue]
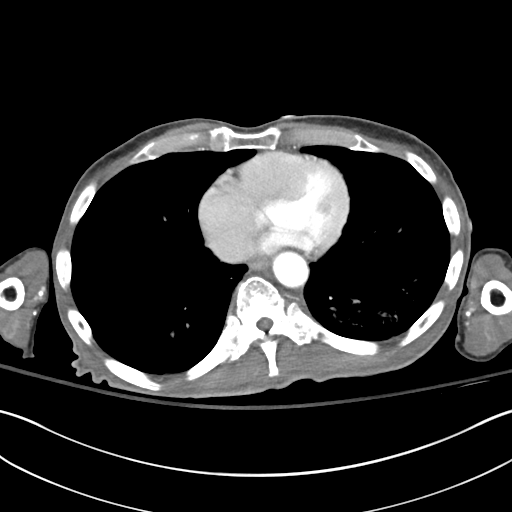

[Series 8: coronal soft tissue · coronal · 0.79mm/px · 3 of 100 slices shown]
[im 34/100  soft-tissue]
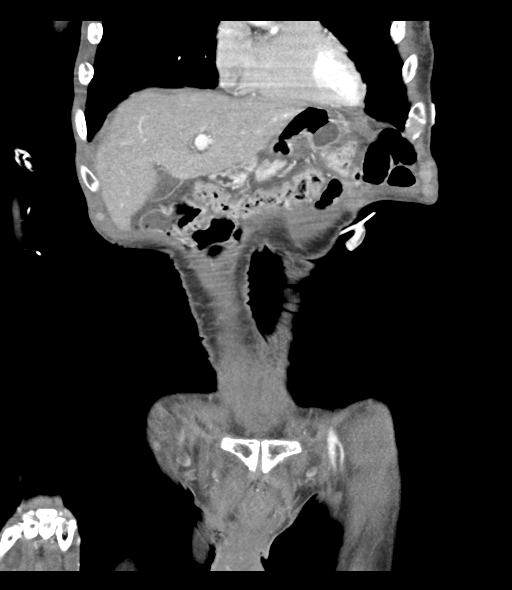
[im 45/100  soft-tissue]
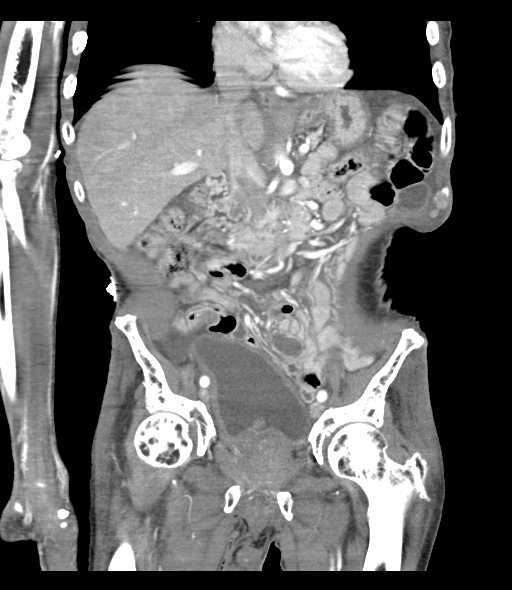
[im 56/100  soft-tissue]
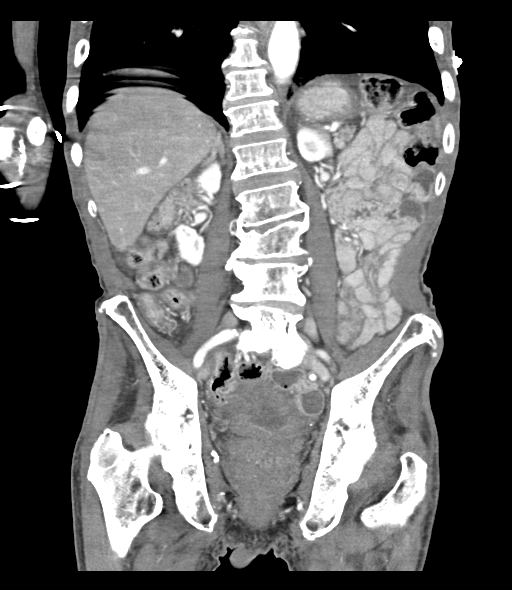

[16 of 46 positions shown; findings below may reference images not displayed]

FINDINGS: Lower chest: Mild patchy airspace and ground-glass densities are
noted within both lung bases. This is improved when compared with
10/04/2020 compatible with resolving pneumonia.

Hepatobiliary: The gallbladder appears within normal limits. No bile
duct dilatation.

No focal liver abnormality.

Pancreas: Unremarkable. No pancreatic ductal dilatation or
surrounding inflammatory changes.

Spleen: Normal in size without focal abnormality.

Adrenals/Urinary Tract: No focal adrenal mass. Small left kidney
cysts are similar to the previous exam. No signs hydronephrosis.

There is moderate distension of the urinary bladder with diffuse
circumferential wall thickening. Marked enlargement of the prostate
gland is identified which has mass effect upon the bladder base.

Stomach/Bowel: Percutaneous gastrostomy tube is again noted. No
abnormal bowel distension. The appendix is not confidently
identified separate from the right lower quadrant bowel loops. No
significant bowel wall thickening or inflammation.

Vascular/Lymphatic: Aortic atherosclerosis. No aneurysm. No
abdominopelvic adenopathy identified.

Reproductive: Prostate gland measures 5.6 by 5.1 by 6.0 cm (volume =
90 cm^3).

Other: No significant free fluid.  No fluid collection identified.

Musculoskeletal: Lumbar degenerative disc disease. No acute or
suspicious osseous findings.
IMPRESSION: 1. No acute findings identified within the abdomen or pelvis.
2. Marked enlargement of the prostate gland with mass effect upon
the bladder base.
3. Moderate distension of the urinary bladder with diffuse
circumferential wall thickening. Findings may reflect sequelae of
chronic bladder outlet obstruction versus cystitis.
4. Improved appearance of bilateral lower lobe airspace and
ground-glass densities compatible with resolving pneumonia.
5. Aortic atherosclerosis.

Aortic Atherosclerosis ([id number]).
